# Patient Record
Sex: Female | Born: 2005 | Race: White | Hispanic: No | Marital: Single | Smoking: Current some day smoker
Health system: Southern US, Community
[De-identification: ages and names within clinical notes are randomized; demographics above are authoritative.]

## PROBLEM LIST (undated history)

## (undated) ENCOUNTER — Emergency Department (HOSPITAL_COMMUNITY): Discharge status: Absent without leave | Payer: Medicaid Other

## (undated) DIAGNOSIS — J45909 Unspecified asthma, uncomplicated: Secondary | ICD-10-CM

## (undated) DIAGNOSIS — F419 Anxiety disorder, unspecified: Secondary | ICD-10-CM

## (undated) DIAGNOSIS — F329 Major depressive disorder, single episode, unspecified: Secondary | ICD-10-CM

## (undated) DIAGNOSIS — G43909 Migraine, unspecified, not intractable, without status migrainosus: Secondary | ICD-10-CM

## (undated) DIAGNOSIS — F32A Depression, unspecified: Secondary | ICD-10-CM

## (undated) DIAGNOSIS — F509 Eating disorder, unspecified: Secondary | ICD-10-CM

## (undated) HISTORY — DX: Unspecified asthma, uncomplicated: J45.909

## (undated) HISTORY — PX: TOE SURGERY: SHX1073

---
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
## 2024-01-21 ENCOUNTER — Encounter: Payer: Self-pay | Admitting: Behavioral Health

## 2024-01-21 ENCOUNTER — Other Ambulatory Visit: Payer: Self-pay

## 2024-01-21 ENCOUNTER — Ambulatory Visit (HOSPITAL_COMMUNITY)
Admission: EM | Admit: 2024-01-21 | Discharge: 2024-01-21 | Disposition: A | Discharge status: Other Acute Inpatient Hospital | Payer: MEDICAID | Attending: Nurse Practitioner | Admitting: Nurse Practitioner

## 2024-01-21 ENCOUNTER — Inpatient Hospital Stay
Admission: AD | Admit: 2024-01-21 | Discharge: 2024-01-28 | DRG: 885 | Disposition: A | Discharge status: Home / Self Care | Payer: MEDICAID | Source: Other Acute Inpatient Hospital | Attending: Psychiatry | Admitting: Psychiatry

## 2024-01-21 DIAGNOSIS — F1290 Cannabis use, unspecified, uncomplicated: Secondary | ICD-10-CM | POA: Insufficient documentation

## 2024-01-21 DIAGNOSIS — F3481 Disruptive mood dysregulation disorder: Secondary | ICD-10-CM | POA: Insufficient documentation

## 2024-01-21 DIAGNOSIS — F332 Major depressive disorder, recurrent severe without psychotic features: Principal | ICD-10-CM | POA: Diagnosis present

## 2024-01-21 DIAGNOSIS — Z789 Other specified health status: Secondary | ICD-10-CM

## 2024-01-21 DIAGNOSIS — Z91410 Personal history of adult physical and sexual abuse: Secondary | ICD-10-CM

## 2024-01-21 DIAGNOSIS — Z91411 Personal history of adult psychological abuse: Secondary | ICD-10-CM | POA: Diagnosis not present

## 2024-01-21 DIAGNOSIS — Z9151 Personal history of suicidal behavior: Secondary | ICD-10-CM | POA: Diagnosis not present

## 2024-01-21 DIAGNOSIS — Z8659 Personal history of other mental and behavioral disorders: Secondary | ICD-10-CM | POA: Insufficient documentation

## 2024-01-21 DIAGNOSIS — F314 Bipolar disorder, current episode depressed, severe, without psychotic features: Principal | ICD-10-CM | POA: Diagnosis present

## 2024-01-21 DIAGNOSIS — R45851 Suicidal ideations: Secondary | ICD-10-CM | POA: Diagnosis present

## 2024-01-21 DIAGNOSIS — F603 Borderline personality disorder: Secondary | ICD-10-CM | POA: Diagnosis present

## 2024-01-21 DIAGNOSIS — Z79899 Other long term (current) drug therapy: Secondary | ICD-10-CM

## 2024-01-21 DIAGNOSIS — Z818 Family history of other mental and behavioral disorders: Secondary | ICD-10-CM | POA: Diagnosis not present

## 2024-01-21 DIAGNOSIS — Z560 Unemployment, unspecified: Secondary | ICD-10-CM

## 2024-01-21 DIAGNOSIS — F4310 Post-traumatic stress disorder, unspecified: Secondary | ICD-10-CM | POA: Diagnosis present

## 2024-01-21 DIAGNOSIS — F3130 Bipolar disorder, current episode depressed, mild or moderate severity, unspecified: Principal | ICD-10-CM | POA: Insufficient documentation

## 2024-01-21 DIAGNOSIS — F17210 Nicotine dependence, cigarettes, uncomplicated: Secondary | ICD-10-CM | POA: Diagnosis present

## 2024-01-21 DIAGNOSIS — F419 Anxiety disorder, unspecified: Secondary | ICD-10-CM | POA: Diagnosis present

## 2024-01-21 DIAGNOSIS — Z7289 Other problems related to lifestyle: Secondary | ICD-10-CM

## 2024-01-21 DIAGNOSIS — Z23 Encounter for immunization: Secondary | ICD-10-CM | POA: Diagnosis not present

## 2024-01-21 DIAGNOSIS — F509 Eating disorder, unspecified: Secondary | ICD-10-CM | POA: Insufficient documentation

## 2024-01-21 DIAGNOSIS — Z716 Tobacco abuse counseling: Secondary | ICD-10-CM

## 2024-01-21 DIAGNOSIS — F909 Attention-deficit hyperactivity disorder, unspecified type: Secondary | ICD-10-CM | POA: Diagnosis present

## 2024-01-21 DIAGNOSIS — Z9152 Personal history of nonsuicidal self-harm: Secondary | ICD-10-CM | POA: Diagnosis not present

## 2024-01-21 DIAGNOSIS — F411 Generalized anxiety disorder: Secondary | ICD-10-CM | POA: Insufficient documentation

## 2024-01-21 DIAGNOSIS — K5900 Constipation, unspecified: Secondary | ICD-10-CM | POA: Diagnosis present

## 2024-01-21 DIAGNOSIS — F1210 Cannabis abuse, uncomplicated: Secondary | ICD-10-CM | POA: Diagnosis present

## 2024-01-21 DIAGNOSIS — J45909 Unspecified asthma, uncomplicated: Secondary | ICD-10-CM | POA: Diagnosis present

## 2024-01-21 DIAGNOSIS — F1090 Alcohol use, unspecified, uncomplicated: Secondary | ICD-10-CM

## 2024-01-21 LAB — CBC WITH DIFFERENTIAL/PLATELET
Abs Immature Granulocytes: 0.08 10*3/uL — ABNORMAL HIGH (ref 0.00–0.07)
Basophils Absolute: 0.1 10*3/uL (ref 0.0–0.1)
Basophils Relative: 0 %
Eosinophils Absolute: 0.1 10*3/uL (ref 0.0–0.5)
Eosinophils Relative: 0 %
HCT: 46.6 % — ABNORMAL HIGH (ref 36.0–46.0)
Hemoglobin: 15.7 g/dL — ABNORMAL HIGH (ref 12.0–15.0)
Immature Granulocytes: 1 %
Lymphocytes Relative: 15 %
Lymphs Abs: 2.4 10*3/uL (ref 0.7–4.0)
MCH: 28.8 pg (ref 26.0–34.0)
MCHC: 33.7 g/dL (ref 30.0–36.0)
MCV: 85.5 fL (ref 80.0–100.0)
Monocytes Absolute: 0.7 10*3/uL (ref 0.1–1.0)
Monocytes Relative: 4 %
Neutro Abs: 12.5 10*3/uL — ABNORMAL HIGH (ref 1.7–7.7)
Neutrophils Relative %: 80 %
Platelets: 458 10*3/uL — ABNORMAL HIGH (ref 150–400)
RBC: 5.45 MIL/uL — ABNORMAL HIGH (ref 3.87–5.11)
RDW: 11.9 % (ref 11.5–15.5)
WBC: 15.8 10*3/uL — ABNORMAL HIGH (ref 4.0–10.5)
nRBC: 0 % (ref 0.0–0.2)

## 2024-01-21 LAB — ETHANOL
Alcohol, Ethyl (B): <10 mg/dL (ref <10)
Alcohol, Ethyl (B): <10 mg/dL (ref <10)

## 2024-01-21 LAB — COMPREHENSIVE METABOLIC PANEL
ALT: 29 U/L (ref 0–44)
AST: 23 U/L (ref 15–41)
Albumin: 4.3 g/dL (ref 3.5–5.0)
Alkaline Phosphatase: 75 U/L (ref 38–126)
Anion gap: 14 (ref 5–15)
BUN: 9 mg/dL (ref 6–20)
CO2: 23 mmol/L (ref 22–32)
Calcium: 9.9 mg/dL (ref 8.9–10.3)
Chloride: 103 mmol/L (ref 98–111)
Creatinine, Ser: 0.75 mg/dL (ref 0.44–1.00)
GFR, Estimated: >60 mL/min (ref >60)
Glucose, Bld: 68 mg/dL — ABNORMAL LOW (ref 70–99)
Potassium: 4 mmol/L (ref 3.5–5.1)
Sodium: 140 mmol/L (ref 135–145)
Total Bilirubin: 1.1 mg/dL (ref 0.0–1.2)
Total Protein: 7.4 g/dL (ref 6.5–8.1)

## 2024-01-21 LAB — POCT URINE DRUG SCREEN - MANUAL ENTRY (I-SCREEN)
POC Amphetamine UR: NOT DETECTED
POC Buprenorphine (BUP): NOT DETECTED
POC Cocaine UR: NOT DETECTED
POC Marijuana UR: POSITIVE — AB
POC Methadone UR: NOT DETECTED
POC Methamphetamine UR: NOT DETECTED
POC Morphine: NOT DETECTED
POC Oxazepam (BZO): NOT DETECTED
POC Oxycodone UR: NOT DETECTED
POC Secobarbital (BAR): NOT DETECTED

## 2024-01-21 LAB — LIPID PANEL
Cholesterol: 151 mg/dL (ref 0–169)
HDL: 49 mg/dL (ref >40)
LDL Cholesterol: 88 mg/dL (ref 0–99)
Total CHOL/HDL Ratio: 3.1 {ratio}
Triglycerides: 71 mg/dL (ref <150)
VLDL: 14 mg/dL (ref 0–40)

## 2024-01-21 LAB — HEMOGLOBIN A1C
Hgb A1c MFr Bld: 5.2 % (ref 4.8–5.6)
Mean Plasma Glucose: 102.54 mg/dL

## 2024-01-21 LAB — TSH: TSH: 0.554 u[IU]/mL (ref 0.350–4.500)

## 2024-01-21 LAB — POC URINE PREG, ED: Preg Test, Ur: NEGATIVE

## 2024-01-21 MED ORDER — ACETAMINOPHEN 325 MG PO TABS
650.0000 mg | ORAL_TABLET | Freq: Four times a day (QID) | ORAL | Status: DC | PRN
  Administered 2024-01-21: 650 mg via ORAL
  Filled 2024-01-21: qty 2

## 2024-01-21 MED ORDER — DIPHENHYDRAMINE HCL 50 MG/ML IJ SOLN: 50.0000 mg | Freq: Three times a day (TID) | INTRAMUSCULAR | Status: DC | PRN

## 2024-01-21 MED ORDER — ADULT MULTIVITAMIN W/MINERALS CH
1.0000 | ORAL_TABLET | Freq: Every day | ORAL | Status: DC
  Administered 2024-01-21 – 2024-01-28 (×8): 1 via ORAL
  Filled 2024-01-21 (×8): qty 1

## 2024-01-21 MED ORDER — ACETAMINOPHEN 325 MG PO TABS
650.0000 mg | ORAL_TABLET | Freq: Four times a day (QID) | ORAL | Status: DC | PRN
  Administered 2024-01-21 – 2024-01-28 (×3): 650 mg via ORAL
  Filled 2024-01-21 (×4): qty 2

## 2024-01-21 MED ORDER — HALOPERIDOL LACTATE 5 MG/ML IJ SOLN: 5.0000 mg | Freq: Three times a day (TID) | INTRAMUSCULAR | Status: DC | PRN

## 2024-01-21 MED ORDER — DIPHENHYDRAMINE HCL 25 MG PO CAPS: 50.0000 mg | ORAL_CAPSULE | Freq: Three times a day (TID) | ORAL | Status: DC | PRN

## 2024-01-21 MED ORDER — LORAZEPAM 2 MG/ML IJ SOLN: 2.0000 mg | Freq: Three times a day (TID) | INTRAMUSCULAR | Status: DC | PRN

## 2024-01-21 MED ORDER — FOLIC ACID 1 MG PO TABS
1.0000 mg | ORAL_TABLET | Freq: Every day | ORAL | Status: DC
  Administered 2024-01-21 – 2024-01-28 (×8): 1 mg via ORAL
  Filled 2024-01-21 (×8): qty 1

## 2024-01-21 MED ORDER — NICOTINE POLACRILEX 2 MG MT GUM: 2.0000 mg | CHEWING_GUM | OROMUCOSAL | Status: DC | PRN

## 2024-01-21 MED ORDER — LORAZEPAM 2 MG PO TABS
0.0000 mg | ORAL_TABLET | Freq: Two times a day (BID) | ORAL | Status: AC
  Administered 2024-01-24: 1 mg via ORAL
  Filled 2024-01-21 (×2): qty 1

## 2024-01-21 MED ORDER — IBUPROFEN 200 MG PO TABS
400.0000 mg | ORAL_TABLET | Freq: Three times a day (TID) | ORAL | Status: DC | PRN
Start: 2024-01-21 — End: 2024-01-28
  Administered 2024-01-27 (×2): 400 mg via ORAL
  Filled 2024-01-21 (×2): qty 2

## 2024-01-21 MED ORDER — ALUM & MAG HYDROXIDE-SIMETH 200-200-20 MG/5ML PO SUSP: 30.0000 mL | ORAL | Status: DC | PRN

## 2024-01-21 MED ORDER — FLUOXETINE HCL 20 MG PO CAPS
40.0000 mg | ORAL_CAPSULE | Freq: Every day | ORAL | Status: DC
  Administered 2024-01-21: 40 mg via ORAL
  Filled 2024-01-21: qty 2

## 2024-01-21 MED ORDER — TRAZODONE HCL 50 MG PO TABS: 50.0000 mg | ORAL_TABLET | Freq: Every evening | ORAL | Status: DC | PRN

## 2024-01-21 MED ORDER — MAGNESIUM HYDROXIDE 400 MG/5ML PO SUSP: 30.0000 mL | Freq: Every day | ORAL | Status: DC | PRN

## 2024-01-21 MED ORDER — ATOMOXETINE HCL 25 MG PO CAPS: 25.0000 mg | ORAL_CAPSULE | Freq: Every day | ORAL | Status: DC

## 2024-01-21 MED ORDER — GUANFACINE HCL ER 2 MG PO TB24: 4.0000 mg | ORAL_TABLET | Freq: Every day | ORAL | Status: DC

## 2024-01-21 MED ORDER — LORAZEPAM 2 MG PO TABS
0.0000 mg | ORAL_TABLET | Freq: Four times a day (QID) | ORAL | Status: AC
  Administered 2024-01-21 – 2024-01-22 (×4): 1 mg via ORAL
  Filled 2024-01-21 (×5): qty 1

## 2024-01-21 MED ORDER — LORAZEPAM 2 MG/ML IJ SOLN: 1.0000 mg | INTRAMUSCULAR | Status: AC | PRN

## 2024-01-21 MED ORDER — THIAMINE MONONITRATE 100 MG PO TABS
100.0000 mg | ORAL_TABLET | Freq: Every day | ORAL | Status: DC
  Administered 2024-01-21 – 2024-01-28 (×8): 100 mg via ORAL
  Filled 2024-01-21 (×8): qty 1

## 2024-01-21 MED ORDER — INFLUENZA VIRUS VACC SPLIT PF (FLUZONE) 0.5 ML IM SUSY
0.5000 mL | PREFILLED_SYRINGE | INTRAMUSCULAR | Status: AC
  Administered 2024-01-23: 0.5 mL via INTRAMUSCULAR
  Filled 2024-01-21: qty 0.5

## 2024-01-21 MED ORDER — GUANFACINE HCL ER 1 MG PO TB24
4.0000 mg | ORAL_TABLET | Freq: Every day | ORAL | Status: DC
Start: 2024-01-21 — End: 2024-01-28
  Administered 2024-01-21 – 2024-01-27 (×6): 4 mg via ORAL
  Filled 2024-01-21 (×7): qty 4

## 2024-01-21 MED ORDER — CARIPRAZINE HCL 1.5 MG PO CAPS
1.5000 mg | ORAL_CAPSULE | Freq: Every day | ORAL | Status: DC
  Administered 2024-01-21: 1.5 mg via ORAL
  Filled 2024-01-21: qty 1

## 2024-01-21 MED ORDER — FLUOXETINE HCL 20 MG PO CAPS
40.0000 mg | ORAL_CAPSULE | Freq: Every day | ORAL | Status: DC
  Administered 2024-01-22 – 2024-01-26 (×5): 40 mg via ORAL
  Filled 2024-01-21 (×5): qty 2

## 2024-01-21 MED ORDER — HALOPERIDOL 5 MG PO TABS: 5.0000 mg | ORAL_TABLET | Freq: Three times a day (TID) | ORAL | Status: DC | PRN

## 2024-01-21 MED ORDER — IBUPROFEN 400 MG PO TABS
400.0000 mg | ORAL_TABLET | Freq: Three times a day (TID) | ORAL | Status: DC | PRN
  Administered 2024-01-21: 400 mg via ORAL
  Filled 2024-01-21: qty 1

## 2024-01-21 MED ORDER — THIAMINE HCL 100 MG/ML IJ SOLN: 100.0000 mg | Freq: Every day | INTRAMUSCULAR | Status: DC

## 2024-01-21 MED ORDER — HYDROXYZINE HCL 25 MG PO TABS
25.0000 mg | ORAL_TABLET | Freq: Three times a day (TID) | ORAL | Status: DC | PRN
  Administered 2024-01-21 – 2024-01-23 (×2): 25 mg via ORAL
  Filled 2024-01-21 (×2): qty 1

## 2024-01-21 MED ORDER — ONDANSETRON 4 MG PO TBDP
4.0000 mg | ORAL_TABLET | Freq: Three times a day (TID) | ORAL | Status: DC | PRN
Start: 2024-01-21 — End: 2024-01-28
  Administered 2024-01-21: 4 mg via ORAL
  Filled 2024-01-21: qty 1

## 2024-01-21 MED ORDER — LORAZEPAM 1 MG PO TABS: 1.0000 mg | ORAL_TABLET | ORAL | Status: AC | PRN

## 2024-01-21 MED ORDER — TRAZODONE HCL 50 MG PO TABS
50.0000 mg | ORAL_TABLET | Freq: Every evening | ORAL | Status: DC | PRN
  Administered 2024-01-21 – 2024-01-26 (×5): 50 mg via ORAL
  Filled 2024-01-21 (×5): qty 1

## 2024-01-21 MED ORDER — MAGNESIUM HYDROXIDE 400 MG/5ML PO SUSP
30.0000 mL | Freq: Every day | ORAL | Status: DC | PRN
  Filled 2024-01-21: qty 30

## 2024-01-21 MED ORDER — HYDROXYZINE HCL 25 MG PO TABS: 25.0000 mg | ORAL_TABLET | Freq: Three times a day (TID) | ORAL | Status: DC | PRN

## 2024-01-21 MED ORDER — CARIPRAZINE HCL 1.5 MG PO CAPS
1.5000 mg | ORAL_CAPSULE | Freq: Every day | ORAL | Status: DC
Start: 2024-01-21 — End: 2024-01-22
  Administered 2024-01-22: 1.5 mg via ORAL
  Filled 2024-01-21 (×2): qty 1

## 2024-01-21 NOTE — ED Notes (Signed)
 Patient A&O x 4, ambulatory. Patient transferred to [facility] in no acute distress. Patient denied SI/HI, A/VH upon discharge. Patient escorted to [place] via staff for transport to [facility] via safe transport. Safety maintained.

## 2024-01-21 NOTE — Progress Notes (Signed)
   01/21/24 1830  Intake (mL)  Percent Meals Eaten (%) 50 %  Feeding Fed self  Other  (MHT stated that she drank half of her Gingerale; 118 mL)

## 2024-01-21 NOTE — Progress Notes (Signed)
   01/21/24 1800  CIWA-Ar  Nausea and Vomiting 2  Tactile Disturbances 0  Tremor 0  Auditory Disturbances 0  Paroxysmal Sweats 1  Visual Disturbances 0  Anxiety 2  Headache, Fullness in Head 3  Agitation 0  Orientation and Clouding of Sensorium 0  CIWA-Ar Total 8

## 2024-01-21 NOTE — Discharge Instructions (Signed)
 Patient accepted to [facility]. Attending MD Parmar  [facility] BMU ADDRESS IS [address] [place].

## 2024-01-21 NOTE — BHH Suicide Risk Assessment (Signed)
 [facility] Admission Suicide Risk Assessment   Nursing information obtained from:    Demographic factors:    Current Mental Status:    Loss Factors:    Historical Factors:    Risk Reduction Factors:     Total Time spent with patient: 2 hours Principal Problem: MDD (major depressive disorder), recurrent severe, without psychosis (HCC) Diagnosis:  Principal Problem:   MDD (major depressive disorder), recurrent severe, without psychosis (HCC) Active Problems:   Self-injurious behavior   Self-induced vomiting for weight loss   Borderline personality disorder (HCC)   Subjective Data: 18 year old female Caucasian  emale,ports feeling severely depressed and suicidal, stating she has felt this way since age 18.Reports suicidal ideation with a plan to choke herself with a advertising account planner.States she developed this plan a few days ago.Reports previous suicide attempt one week ago by overdosing on five trazodone  100 mg tablets and consuming a bottle of wine.Reports a history of self-harm, including cutting her throat with a razor blade a few weeks ago.Denies homicidal ideation, hallucinations, or access to weapons.Reports engaging in purging behaviors, stating Sometimes I stick my finger down my throat to throw up. I want to be skinny.Reports daily marijuana use (2 blunts and gummies) and drinking a bottle of wine nightly for the past week.States she wants to detox from alcohol and marijuana. Patient acknowledges she cannot keep herself safe if discharged.Reports nonadherence to prescribed medications.Patient states she has been drinking a bottle of wine and using marijuana (two blunts and gummies) nightly for the past week and expresses a desire to detox. She identifies multiple stressors, including her mother's recent move, a breakup, her grandfather's critical illness, and concerns about her elderly dog.She has a pending therapy appointment with Ellouise Dawn in April 2025. She states she does not feel safe to return  home. Her grandmother supports this concern and does not believe the patient will be safe if discharged.    Continued Clinical Symptoms:     The Alcohol Use Disorders Identification Test, Guidelines for Use in Primary Care, Second Edition.  World Science Writer [facility]). Score between 0-7:  no or low risk or alcohol related problems. Score between 8-15:  moderate risk of alcohol related problems. Score between 16-19:  high risk of alcohol related problems. Score 20 or above:  warrants further diagnostic evaluation for alcohol dependence and treatment.   CLINICAL FACTORS:   Depression:   Comorbid alcohol abuse/dependence Impulsivity Alcohol/Substance Abuse/Dependencies Personality Disorders:   Cluster B Comorbid alcohol abuse/dependence Comorbid depression More than one psychiatric diagnosis   Musculoskeletal: Strength & Muscle Tone: within normal limits Gait & Station: normal Patient leans: N/A  Psychiatric Specialty Exam:  Presentation  General Appearance:  Appropriate for Environment; Neat (appears fatigued)  Eye Contact: Minimal  Speech: Clear and Coherent; Normal Rate  Speech Volume: Normal  Handedness: Right   Mood and Affect  Mood: Depressed  Affect: Flat; Restricted; Congruent   Thought Process  Thought Processes: Coherent  Descriptions of Associations:Intact  Orientation:Full (Time, Place and Person)  Thought Content:WDL  History of Schizophrenia/Schizoaffective disorder:No   Hallucinations:Hallucinations: None  Ideas of Reference:None  Suicidal Thoughts:Suicidal Thoughts: Yes, Passive SI Active Intent and/or Plan: Without Intent; Without Means to Carry Out; Without Access to Means; Without Plan SI Passive Intent and/or Plan: Without Intent; Without Plan; Without Means to Carry Out; Without Access to Means  Homicidal Thoughts:Homicidal Thoughts: No   Sensorium  Memory: Immediate Good; Recent Good; Remote  Good  Judgment: Impaired  Insight: Poor   Art Therapist  Concentration: Fair  Attention Span: Fair  Recall: Good  Fund of Knowledge: Good  Language: Good   Psychomotor Activity  Psychomotor Activity: Psychomotor Activity: Normal   Assets  Assets: Social Support; Health And Safety Inspector; Desire for Improvement; Communication Skills   Sleep  Sleep: Sleep: Fair Number of Hours of Sleep: 4    Physical Exam: Physical Exam Vitals and nursing note reviewed.  Constitutional:      Appearance: Normal appearance.  HENT:     Head: Normocephalic and atraumatic.     Nose: Nose normal.  Pulmonary:     Effort: Pulmonary effort is normal.  Musculoskeletal:        General: Normal range of motion.     Cervical back: Normal range of motion.  Neurological:     General: No focal deficit present.     Mental Status: She is alert and oriented to person, place, and time. Mental status is at baseline.  Psychiatric:        Attention and Perception: Attention and perception normal.        Mood and Affect: Mood is anxious and depressed. Affect is flat.        Speech: Speech normal.        Behavior: Behavior is withdrawn. Behavior is cooperative.        Thought Content: Thought content includes suicidal ideation.        Cognition and Memory: Cognition and memory normal.        Judgment: Judgment is impulsive.    Review of Systems  Gastrointestinal:  Positive for nausea and vomiting.  Psychiatric/Behavioral:  Positive for substance abuse and suicidal ideas. The patient is nervous/anxious.   All other systems reviewed and are negative.  Blood pressure (!) 135/95, pulse 93, temperature 98.5 F (36.9 C), temperature source Oral, resp. rate 18, SpO2 95%. There is no height or weight on file to calculate BMI.   COGNITIVE FEATURES THAT CONTRIBUTE TO RISK:  None    SUICIDE RISK:   Mild:  Suicidal ideation of limited frequency, intensity, duration, and  specificity.  There are no identifiable plans, no associated intent, mild dysphoria and related symptoms, good self-control (both objective and subjective assessment), few other risk factors, and identifiable protective factors, including available and accessible social support.  PLAN OF CARE:  Aripiprazole  (Abilify ) 10 mg daily - Augmentation for depressive symptoms and mood stabilization. Hydroxyzine  (Atarax ) 25 mg three times daily as needed - Anxiety management and sedation. Cariprazine  (Vraylar ) 1.5 mg daily - Mood stabilization for underlying mood disorder. Fluoxetine  (Prozac ) 40 mg daily - Treatment for major depressive disorder. Ondansetron  (Zofran ) 4 mg as needed - To manage nausea associated with anxiety and withdrawal symptoms. CIWA with Ativan  protocol - To assess and manage potential alcohol withdrawal symptoms.  Monitor for suicidal ideation and self-harm behaviors  Implement eating disorder monitoring. I certify that inpatient services furnished can reasonably be expected to improve the patient's condition.   Brad GORMAN Moats, NP 01/21/2024, 5:23 PM
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
## 2024-01-21 NOTE — Group Note (Signed)
 Date:  01/21/2024 Time:  11:05 PM  Group Topic/Focus:  Wrap-Up Group:   The focus of this group is to help patients review their daily goal of treatment and discuss progress on daily workbooks.    Participation Level:  Did Not Attend Maglione,Randell Detter E 01/21/2024, 11:05 PM

## 2024-01-21 NOTE — Group Note (Signed)
 Date:  01/21/2024 Time:  5:42 PM  Group Topic/Focus:  Outdoor recreation  therapy  structured activity    Participation Level:  Did Not Attend   Debbie Long 01/21/2024, 5:42 PM

## 2024-01-21 NOTE — ED Notes (Signed)
 Nurse to nurse report given to Demetria, RN @[facility] . Safe transport called. Pt made aware and agree to transport. Safety maintained.

## 2024-01-21 NOTE — ED Notes (Signed)
 Patient A&Ox4. Pleasant when approached. Pt denies SI at present but states, It always happen around 8pm. It's like routine. In the day, I do things to occupy my time so I'm distracted from those thoughts to hurt myself but at night, I'm consumed by the thoughts to end my life. I've been like this since I was 18yo. Like, I don't feel like it right at this moment but I know if I go home, I'm gonna do something to myself. In here, I'm safe but not at home. Support and encouragement provided. Denies A/VH. Patient c/o migraine 4/10. Pt received Tylenol  earlier with some relief noted. Motrin  ordered and will be administered. Routine safety checks conducted according to facility protocol. Encouraged patient to notify staff if thoughts of harm toward self or others arise. Patient verbalize understanding and agreement. Able to contract for safety in facility only. Pt accepted to [facility]. Voluntary consent form faxed successfully to facility. Awaiting callback to give nurse to nurse report. Will continue to monitor for safety.

## 2024-01-21 NOTE — Plan of Care (Signed)
  Problem: Education: Goal: Knowledge of [facility] Education information/materials will improve Outcome: Progressing Goal: Emotional status will improve Outcome: Progressing Goal: Mental status will improve Outcome: Progressing Goal: Verbalization of understanding the information provided will improve Outcome: Progressing   Problem: Activity: Goal: Interest or engagement in activities will improve Outcome: Progressing Goal: Sleeping patterns will improve Outcome: Progressing   Problem: Coping: Goal: Ability to verbalize frustrations and anger appropriately will improve Outcome: Progressing Goal: Ability to demonstrate self-control will improve Outcome: Progressing   Problem: Health Behavior/Discharge Planning: Goal: Identification of resources available to assist in meeting health care needs will improve Outcome: Progressing Goal: Compliance with treatment plan for underlying cause of condition will improve Outcome: Progressing   Problem: Physical Regulation: Goal: Ability to maintain clinical measurements within normal limits will improve Outcome: Progressing   Problem: Safety: Goal: Periods of time without injury will increase Outcome: Progressing   Problem: Coping: Goal: Coping ability will improve Outcome: Progressing Goal: Will verbalize feelings Outcome: Progressing   Problem: Self-Concept: Goal: Ability to identify factors that promote anxiety will improve Outcome: Progressing Goal: Level of anxiety will decrease Outcome: Progressing Goal: Ability to modify response to factors that promote anxiety will improve Outcome: Progressing   Problem: Physical Regulation: Goal: Complications related to the disease process, condition or treatment will be avoided or minimized Outcome: Progressing   Problem: Safety: Goal: Ability to remain free from injury will improve Outcome: Progressing   Problem: Health Behavior/Discharge Planning: Goal:  Identification of resources available to assist in meeting health care needs will improve Outcome: Progressing

## 2024-01-21 NOTE — ED Provider Notes (Signed)
 FBC/OBS ASAP Discharge Summary  Date and Time: 01/21/2024 8:37 AM  Name: Debbie Long  MRN:  [id number]   Discharge Diagnoses:  Final diagnoses:  Suicidal ideation  Severe episode of recurrent major depressive disorder, without psychotic features (HCC)  PTSD (post-traumatic stress disorder)    Subjective: Patient accepted to [facility] this morning. Patient evaluated face-to-face by this provider prior to transfer. Patient initially presented with complaints of suicidal ideations with a plan to choke herself with a advertising account planner. On reevaluation, patient denies active suicidal ideations at this time. She denies homicidal ideations.  She denies auditory or visual hallucinations. There is no objective evidence that the patient is currently responding to internal or external stimuli, or experiencing delusional paranoia thought content. Patient reports worsening depression. She reports physical complaints of a migraine that she rates 6 out of 10 with 10 being the worst. She denies taking prescribed medications for migraines. Patient offered Motrin  as needed for pain alternating with Tylenol  as needed for pain. Patient stable to transfer to the [facility] this morning.  Stay Summary: Per chart review, H&P., Debbie Long is a 17 y.o. female with a history of DMDD, MDD, GAD, PTSD, suicidal ideations and eating disorders patient presented to [facility] as a walk in accompanied by grandmother Debbie Long with complaints of worsening depression and suicidal ideation.   On evaluation patient reports that she is depressed and suicidal and that she has been suicidal since she was 18 years old. Patient states that she has been smoking marijuana ( 2 blunts) and using marijuana gummies and drinking a 1 bottle of wine each night for the past week. Patient reports multiple stressors that include her mother recently moving away to get married in  [place] , a relationship break up, grandfather is very ill, her dog is elderly and not in good health. Patient states that she dos not feel that she can keep her self safe if she returns home. Patient stated that a week ago she attempted an overdose on five trazodone  100 mg tabs and one bottle of wine. Patient reports that she fell asleep and woke up the next day. Patient reports that she did not seek any treatment for overdose attempt. Patient reports a few weeks before her trazodone  overdose patient stated that she cut her throat with a razor blade.   Patient is currently being followed by Cletis Li for outpatient services and is currently prescribed Strattera  25 mg, Prozac  40 mg, Vraylar  1.5 mg,  hydroxyzine  25 mg and guanfacine  4 mg. Patient states that she is not currently in therapy, but has an initial therapy appointment in April with [facility] outpatient clinic.  Total Time spent with patient: 30 minutes  Past Psychiatric History: History of PTSD, DMDD, MDD and ADHD. Patient hospitalized at [facility]10[facility].   Past Medical History: History of migraines. Patient denies taking prescribed medications for migraines at this time.   Family Psychiatric History: Mother history of bipolar and brother hx of autism.   Social History: Patient resides with her (m) grandparents. Patient states that she graduated from high school in December 2024. Patient reports occasional alcohol and marijuana use.  Tobacco Cessation:  N/A, patient does not currently use tobacco products  Current Medications:  Current Facility-Administered Medications  Medication Dose Route Frequency Provider Last Rate Last Admin   acetaminophen  (TYLENOL ) tablet 650 mg  650 mg Oral Q6H PRN Bobbitt, Shalon E, NP   650 mg at  01/21/24 0422   alum & mag hydroxide-simeth (MAALOX/MYLANTA) 200-200-20 MG/5ML suspension 30 mL  30 mL Oral Q4H PRN Bobbitt, Shalon E, NP       cariprazine  (VRAYLAR ) capsule 1.5 mg  1.5 mg Oral Daily Bobbitt,  Shalon E, NP       FLUoxetine  (PROZAC ) capsule 40 mg  40 mg Oral Daily Bobbitt, Shalon E, NP       guanFACINE  (INTUNIV ) ER tablet 4 mg  4 mg Oral QHS Bobbitt, Shalon E, NP       hydrOXYzine  (ATARAX ) tablet 25 mg  25 mg Oral TID PRN Bobbitt, Shalon E, NP       ibuprofen  (ADVIL ) tablet 400 mg  400 mg Oral Q8H PRN Atiyah Bauer L, NP       magnesium  hydroxide (MILK OF MAGNESIA) suspension 30 mL  30 mL Oral Daily PRN Bobbitt, Shalon E, NP       traZODone  (DESYREL ) tablet 50 mg  50 mg Oral QHS PRN Bobbitt, Shalon E, NP       Current Outpatient Medications  Medication Sig Dispense Refill   albuterol  (PROVENTIL  HFA;VENTOLIN  HFA) 108 (90 Base) MCG/ACT inhaler Inhale 2 puffs into the lungs every 4 (four) hours as needed for wheezing or shortness of breath.     cariprazine  (VRAYLAR ) 1.5 MG capsule Take by mouth.     FLUoxetine  (PROZAC ) 20 MG capsule Take 1 capsule (20 mg total) by mouth daily. 30 capsule 0    PTA Medications:  Facility Ordered Medications  Medication   acetaminophen  (TYLENOL ) tablet 650 mg   alum & mag hydroxide-simeth (MAALOX/MYLANTA) 200-200-20 MG/5ML suspension 30 mL   magnesium  hydroxide (MILK OF MAGNESIA) suspension 30 mL   hydrOXYzine  (ATARAX ) tablet 25 mg   traZODone  (DESYREL ) tablet 50 mg   cariprazine  (VRAYLAR ) capsule 1.5 mg   FLUoxetine  (PROZAC ) capsule 40 mg   guanFACINE  (INTUNIV ) ER tablet 4 mg   ibuprofen  (ADVIL ) tablet 400 mg   PTA Medications  Medication Sig   albuterol  (PROVENTIL  HFA;VENTOLIN  HFA) 108 (90 Base) MCG/ACT inhaler Inhale 2 puffs into the lungs every 4 (four) hours as needed for wheezing or shortness of breath.   FLUoxetine  (PROZAC ) 20 MG capsule Take 1 capsule (20 mg total) by mouth daily.   cariprazine  (VRAYLAR ) 1.5 MG capsule Take by mouth.    Flowsheet Row ED from 01/21/2024 in [facility] ED from 10/08/2023 in [facility] Emergency Department at [facility]mission (Discharged[facility]  C-SSRS RISK CATEGORY High Risk High Risk High Risk       Musculoskeletal  Strength & Muscle Tone: within normal limits Gait & Station: normal Patient leans: N/A  Psychiatric Specialty Exam  Presentation  General Appearance:  Casual  Eye Contact: Good  Speech: Clear and Coherent  Speech Volume: Normal  Handedness: Right   Mood and Affect  Mood: Euthymic  Affect: Appropriate   Thought Process  Thought Processes: Coherent  Descriptions of Associations:Intact  Orientation:Full (Time, Place and Person)  Thought Content:WDL  Diagnosis of Schizophrenia or Schizoaffective disorder in past: No    Hallucinations:Hallucinations: None  Ideas of Reference:None  Suicidal Thoughts:No  Homicidal Thoughts:Homicidal Thoughts: No   Sensorium  Memory: Immediate Fair; Recent Fair; Remote Fair  Judgment: Poor  Insight: Fair   Chartered Certified Accountant: Fair  Attention Span: Fair  Recall: [employer] of Knowledge: Fair  Language: Fair   Psychomotor Activity  Psychomotor Activity: Psychomotor Activity: Normal  Assets  Assets: Manufacturing Systems Engineer; Housing; Resilience; Social Support   Sleep  Sleep: Sleep: Fair Number of Hours of Sleep: 5   Nutritional Assessment (For OBS and FBC admissions only) Has the patient had a weight loss or gain of 10 pounds or more in the last 3 months?: No Has the patient had a decrease in food intake/or appetite?: No Does the patient have dental problems?: No Does the patient have eating habits or behaviors that may be indicators of an eating disorder including binging or inducing vomiting?: No Has the patient recently lost weight without trying?: 0 Has the patient been eating poorly because of a decreased appetite?: 0 Malnutrition Screening Tool Score: 0    Physical Exam  Physical Exam Cardiovascular:     Rate and Rhythm: Normal rate.   Pulmonary:     Effort: Pulmonary effort is normal.  Musculoskeletal:        General: Normal range of motion.     Cervical back: Normal range of motion.  Neurological:     Mental Status: She is alert and oriented to person, place, and time.    Review of Systems  Constitutional: Negative.   HENT: Negative.    Eyes: Negative.   Respiratory: Negative.    Cardiovascular: Negative.   Gastrointestinal: Negative.   Genitourinary: Negative.   Musculoskeletal: Negative.   Neurological:  Positive for headaches.  Endo/Heme/Allergies: Negative.   Psychiatric/Behavioral:  Positive for depression and suicidal ideas.    Blood pressure (!) 157/90, pulse 84, temperature 98.8 F (37.1 C), temperature source Oral, resp. rate 16, SpO2 98%. There is no height or weight on file to calculate BMI.  Disposition: Patient accepted to  [facility] BMU. Attending MD Victoria. Patient is voluntary. Admission orders placed for [facility].   Teresa Wyline CROME, NP 01/21/2024, 8:37 AM
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
## 2024-01-21 NOTE — ED Notes (Signed)
 Pt A&O x 4, sad & depressed, presents with suicidal ideation, plan to hang self with phone charger, previous SI attempts noted, overdose and attempt to cut throat.  Denies AVH. Pt contracts for safety.  Comfort measures given.  Monitoring for safety.

## 2024-01-21 NOTE — Progress Notes (Signed)
   01/21/24 1800  Charting Type  Charting Type Admission  Safety Check Verification  Has the RN verified the 15 minute safety check completion? Yes  Neurological  Neuro (WDL) WDL  HEENT  HEENT (WDL) X  R Eye Eyeglasses  L Eye Eyeglasses  Teeth Intact  Tongue Pink;Moist  Mucous Membrane(s) Moist;Pink  Voice Clear  Respiratory  Respiratory (WDL) WDL  Cardiac  Cardiac (WDL) WDL  Vascular  Vascular (WDL) WDL  Integumentary  Integumentary (WDL) WDL  Staff Member Assisting with Skin Assessment on Admission [place], RN  Skin Color Appropriate for ethnicity  Skin Condition Dry  Skin Integrity Intact;Other (Comment) (stretch marks)  Skin Turgor Non-tenting  Braden Scale (Ages 8 and up)  Sensory Perceptions 4  Moisture 4  Activity 4  Mobility 4  Nutrition 3  Friction and Shear 3  Braden Scale Score 22  Musculoskeletal  Musculoskeletal (WDL) WDL  Assistive Device None  Gastrointestinal  Gastrointestinal (WDL) WDL  Last BM Date  01/19/24  GU Assessment  Genitourinary (WDL) WDL  Neurological  Level of Consciousness Alert

## 2024-01-21 NOTE — BHH Counselor (Addendum)
 LCSWA attempted to complete the PSA with the patient but she was asleep and did not respond to name being called or knock on the door. When patient arrived she was sick so she went right to sleep.    Emil Harada, MSW, LCSWA

## 2024-01-21 NOTE — Tx Team (Signed)
 Initial Treatment Plan 01/21/2024 6:56 PM Debbie Long [id number]    PATIENT STRESSORS: Health problems   Marital or family conflict   Substance abuse     PATIENT STRENGTHS: Communication skills  Motivation for treatment/growth  Supportive family/friends    PATIENT IDENTIFIED PROBLEMS: SI prior to admission to BMU  Anxiety  Depression  Alcohol abuse  Substance use             DISCHARGE CRITERIA:  Ability to meet basic life and health needs Improved stabilization in mood, thinking, and/or behavior Medical problems require only outpatient monitoring Motivation to continue treatment in a less acute level of care Need for constant or close observation no longer present Reduction of life-threatening or endangering symptoms to within safe limits Withdrawal symptoms are absent or subacute and managed without 24-hour nursing intervention  PRELIMINARY DISCHARGE PLAN: Outpatient therapy Return to previous living arrangement  PATIENT/FAMILY INVOLVEMENT: This treatment plan has been presented to and reviewed with the patient, Debbie Long. The patient has been given the opportunity to ask questions and make suggestions.  Francely Craw, RN 01/21/2024, 6:56 PM

## 2024-01-21 NOTE — Progress Notes (Signed)
   01/21/24 1855  Psych Admission Type (Psych Patients Only)  Admission Status Voluntary  Psychosocial Assessment  Patient Complaints Anxiety;Depression (patient states I've been dealing with a lot, but didn't go into detail with this clinical research associate.)  Eye Contact Fair  Facial Expression Pained;Other (Comment) (patient reported that she isn't feeling well)  Affect Sullen  Speech Soft  Interaction Assertive  Motor Activity Slow  Appearance/Hygiene Unremarkable  Behavior Characteristics Cooperative;Appropriate to situation  Mood Sullen;Pleasant  Aggressive Behavior  Effect No apparent injury  Thought Process  Coherency Circumstantial  Content WDL  Delusions None reported or observed  Perception WDL  Hallucination None reported or observed  Judgment WDL  Confusion None  Danger to Self  Current suicidal ideation? Denies  Self-Injurious Behavior No self-injurious ideation or behavior indicators observed or expressed   Agreement Not to Harm Self Yes  Description of Agreement Verbal  Danger to Others  Danger to Others None reported or observed

## 2024-01-21 NOTE — ED Provider Notes (Signed)
 [facility] Continuous Assessment Admission H&P  Date: 01/21/24 Patient Name: Debbie Long MRN: [id number] Chief Complaint: suicidal ideation  Diagnoses:  Final diagnoses:  Suicidal ideation  Severe episode of recurrent major depressive disorder, without psychotic features (HCC)  PTSD (post-traumatic stress disorder)    HPI: BRITLEY Long is a 18 y.o. female with a history of DMDD, MDD, GAD, PTSD, suicidal ideations and eating disorders patient presented to [facility] as a walk in accompanied by grandmother Debbie Long with complaints of worsening depression and suicidal ideation.  Patient seen face to face by this provider and chart reviewed on 01/21/24.  On evaluation patient reports that she is depressed and suicidal and that she has been suicidal since she was 18 years old.  Patient states that she has been smoking marijuana ( 2 blunts) and using marijuana gummies and drinking a 1 bottle of wine each night for the past week.  Patient reports multiple stressors that include her mother recently moving away to get married in [place] , a relationship break up, grandfather is very ill, her dog is elderly and not in good health. Patient states that she dos not feel that she can keep her self safe if she returns home. Patient stated that a week ago she attempted an overdose on five trazodone  100 mg tabs and one bottle of wine. Patient reports that she fell asleep and woke up the next day. Patient reports that she did not seek any treatment for overdose attempt. Patient reports a few weeks before her trazodone  overdose patient stated that she cut her throat with a razor blade.  Patient is currently being followed by Cletis Li for outpatient services and is currently prescribed Strattera  25 mg, Prozac  40 mg, Vraylar  1.5 mg,  hydroxyzine  25 mg and guanfacine  4 mg.  Patient states that she is not currently in therapy, but has an initial therapy appointment in April with [facility] outpatient  clinic.  During evaluation patient is sitting in a chair in the assessment room and in no acute distress. She is alert, oriented x 4, calm, cooperative and attentive. Her mood is euthymic with congruent affect. normal speech, and behavior.  Patient denies any HI or AVH. Objectively there is no evidence of psychosis/mania or delusional thinking.  Patient is able to converse coherently, goal directed thoughts, no distractibility, or pre-occupation. Patient endorses suicidal ideation with a plan to choke herself with her phone charger.  Patient does not appear to be experiencing any psychosis, paranoia or delusions. Patient answered questions appropriately.   Patient recommended for inpatient treatment and will be admitted to [facility] continuous observation for crisis management, stabilization and safety.    Total Time spent with patient: 20 minutes  Musculoskeletal  Strength & Muscle Tone: within normal limits Gait & Station: normal Patient leans: N/A  Psychiatric Specialty Exam  Presentation General Appearance:  Casual  Eye Contact: Good  Speech: Clear and Coherent  Speech Volume: Normal  Handedness: Right   Mood and Affect  Mood: Euthymic  Affect: Appropriate   Thought Process  Thought Processes: Coherent  Descriptions of Associations:Intact  Orientation:Full (Time, Place and Person)  Thought Content:WDL    Hallucinations:Hallucinations: None  Ideas of Reference:None  Suicidal Thoughts:Suicidal Thoughts: Yes, Active SI Active Intent and/or Plan: With Intent; With Plan  Homicidal Thoughts:Homicidal Thoughts: No   Sensorium  Memory: Immediate Fair; Recent Fair; Remote Fair  Judgment: Poor  Insight: Fair   Chartered Certified Accountant: Fair  Attention Span: Fair  Recall:  Fair  [employer] of Knowledge: Fair  Language: Fair   Lexicographer Activity: Psychomotor Activity: Normal   Assets   Assets: Manufacturing Systems Engineer; Housing; Resilience; Social Support   Sleep  Sleep: Sleep: Fair Number of Hours of Sleep: 5   Nutritional Assessment (For OBS and FBC admissions only) Has the patient had a weight loss or gain of 10 pounds or more in the last 3 months?: No Has the patient had a decrease in food intake/or appetite?: No Does the patient have dental problems?: No Does the patient have eating habits or behaviors that may be indicators of an eating disorder including binging or inducing vomiting?: No Has the patient recently lost weight without trying?: 0 Has the patient been eating poorly because of a decreased appetite?: 0 Malnutrition Screening Tool Score: 0    Physical Exam HENT:     Head: Normocephalic and atraumatic.     Nose: Nose normal.  Eyes:     Pupils: Pupils are equal, round, and reactive to light.  Cardiovascular:     Rate and Rhythm: Normal rate.  Pulmonary:     Effort: Pulmonary effort is normal.  Abdominal:     General: Abdomen is flat.  Musculoskeletal:        General: Normal range of motion.     Cervical back: Normal range of motion.  Skin:    General: Skin is warm.  Neurological:     Mental Status: She is alert and oriented to person, place, and time.  Psychiatric:        Attention and Perception: Attention normal.        Mood and Affect: Mood is depressed. Affect is blunt.        Speech: Speech normal.        Behavior: Behavior is cooperative.        Thought Content: Thought content is not paranoid or delusional. Thought content includes suicidal ideation. Thought content does not include homicidal ideation. Thought content includes suicidal plan. Thought content does not include homicidal plan.        Cognition and Memory: Cognition normal.        Judgment: Judgment is impulsive.   Review of Systems  Constitutional: Negative.   HENT: Negative.    Eyes: Negative.   Respiratory: Negative.    Cardiovascular: Negative.    Gastrointestinal: Negative.   Genitourinary: Negative.   Musculoskeletal: Negative.   Skin: Negative.   Neurological: Negative.   Endo/Heme/Allergies: Negative.   Psychiatric/Behavioral:  Positive for suicidal ideas.     Blood pressure 136/86, pulse (!) 116, temperature 99.1 F (37.3 C), temperature source Oral, resp. rate 20, SpO2 97%. There is no height or weight on file to calculate BMI.  Past Psychiatric History: [facility] 07/10/23  Is the patient at risk to self? Yes  Has the patient been a risk to self in the past 6 months? Yes .    Has the patient been a risk to self within the distant past? Yes   Is the patient a risk to others? No   Has the patient been a risk to others in the past 6 months? No   Has the patient been a risk to others within the distant past? No   Past Medical History:  No current facility-administered medications on file prior to encounter.   Current Outpatient Medications on File Prior to Encounter  Medication Sig Dispense Refill   albuterol  (PROVENTIL  HFA;VENTOLIN  HFA) 108 (90 Base) MCG/ACT inhaler Inhale 2 puffs into  the lungs every 4 (four) hours as needed for wheezing or shortness of breath.     ARIPiprazole  (ABILIFY ) 10 MG tablet Take 1 tablet (10 mg total) by mouth daily. 30 tablet 0   aspirin -acetaminophen -caffeine  (EXCEDRIN  MIGRAINE) 250-250-65 MG tablet Take 1-2 tablets by mouth every 6 (six) hours as needed for headache or migraine.     atomoxetine  (STRATTERA ) 25 MG capsule Take 1 capsule (25 mg total) by mouth daily. (Patient not taking: Reported on 11/07/2023) 30 capsule 0   cariprazine  (VRAYLAR ) 1.5 MG capsule Take by mouth.     FLUoxetine  (PROZAC ) 20 MG capsule Take 1 capsule (20 mg total) by mouth daily. 30 capsule 0   hydrOXYzine  (ATARAX ) 25 MG tablet Take 25 mg by mouth 3 (three) times daily as needed.     traZODone  (DESYREL ) 100 MG tablet Take 1 tablet (100 mg total) by mouth every evening. (Patient not taking: Reported on 11/07/2023) 30  tablet 0     Family History: Mother-bipolar, brother-autistic  Social History: 18 y/o female lives at home with her maternal grandmother and grandfather  Last Labs:  Office Visit on 11/07/2023  Component Date Value Ref Range Status   Preg Test, Ur 11/07/2023 Negative  Negative Final  Admission on 10/08/2023, Discharged on 10/09/2023  Component Date Value Ref Range Status   Sodium 10/08/2023 138  135 - 145 mmol/L Final   Potassium 10/08/2023 3.2 (L)  3.5 - 5.1 mmol/L Final   Chloride 10/08/2023 105  98 - 111 mmol/L Final   CO2 10/08/2023 22  22 - 32 mmol/L Final   Glucose, Bld 10/08/2023 145 (H)  70 - 99 mg/dL Final   Glucose reference range applies only to samples taken after fasting for at least 8 hours.   BUN 10/08/2023 7  6 - 20 mg/dL Final   Creatinine, Ser 10/08/2023 0.75  0.44 - 1.00 mg/dL Final   Calcium 89/72/7975 8.6 (L)  8.9 - 10.3 mg/dL Final   Total Protein 89/72/7975 6.9  6.5 - 8.1 g/dL Final   Albumin 89/72/7975 4.2  3.5 - 5.0 g/dL Final   AST 89/72/7975 22  15 - 41 U/L Final   ALT 10/08/2023 25  0 - 44 U/L Final   Alkaline Phosphatase 10/08/2023 52  38 - 126 U/L Final   Total Bilirubin 10/08/2023 1.0  0.3 - 1.2 mg/dL Final   GFR, Estimated 10/08/2023 >60  >60 mL/min Final   Comment: (NOTE) Calculated using the CKD-EPI Creatinine Equation (2021)    Anion gap 10/08/2023 11  5 - 15 Final   Performed at [facility], [address], [place], [place] [address]   Alcohol, Ethyl (B) 10/08/2023 <10  <10 mg/dL Final   Comment: (NOTE) Lowest detectable limit for serum alcohol is 10 mg/dL.  For medical purposes only. Performed at [facility], [address], [place], [place] [address]    Salicylate Lvl 10/08/2023 <7.0 (L)  7.0 - 30.0 mg/dL Final   Performed at [facility], [address] [place], [place] [address]   Acetaminophen  (Tylenol ), Serum 10/08/2023 <10 (L)  10 - 30 ug/mL Final   Comment: (NOTE) Therapeutic concentrations vary  significantly. A range of 10-30 ug/mL  may be an effective concentration for many patients. However, some  are best treated at concentrations outside of this range. Acetaminophen  concentrations >150 ug/mL at 4 hours after ingestion  and >50 ug/mL at 12 hours after ingestion are often associated with  toxic reactions.  Performed at  [facility], [address], [place], [place] [address]    WBC 10/08/2023 13.8 (H)  4.0 - 10.5 K/uL Final   RBC 10/08/2023 4.85  3.87 - 5.11 MIL/uL Final   Hemoglobin 10/08/2023 14.1  12.0 - 15.0 g/dL Final   HCT 89/72/7975 42.5  36.0 - 46.0 % Final   MCV 10/08/2023 87.6  80.0 - 100.0 fL Final   MCH 10/08/2023 29.1  26.0 - 34.0 pg Final   MCHC 10/08/2023 33.2  30.0 - 36.0 g/dL Final   RDW 89/72/7975 13.0  11.5 - 15.5 % Final   Platelets 10/08/2023 333  150 - 400 K/uL Final   nRBC 10/08/2023 0.0  0.0 - 0.2 % Final   Performed at [facility], [address], [place], [place] [address]   Opiates 10/08/2023 NONE DETECTED  NONE DETECTED Final   Cocaine 10/08/2023 NONE DETECTED  NONE DETECTED Final   Benzodiazepines 10/08/2023 NONE DETECTED  NONE DETECTED Final   Amphetamines 10/08/2023 POSITIVE (A)  NONE DETECTED Final   Tetrahydrocannabinol 10/08/2023 NONE DETECTED  NONE DETECTED Final   Barbiturates 10/08/2023 NONE DETECTED  NONE DETECTED Final   Comment: (NOTE) DRUG SCREEN FOR MEDICAL PURPOSES ONLY.  IF CONFIRMATION IS NEEDED FOR ANY PURPOSE, NOTIFY LAB WITHIN 5 DAYS.  LOWEST DETECTABLE LIMITS FOR URINE DRUG SCREEN Drug Class                     Cutoff (ng/mL) Amphetamine and metabolites    1000 Barbiturate and metabolites    200 Benzodiazepine                 200 Opiates and metabolites        300 Cocaine and metabolites        300 THC                            50 Performed at [facility], [address] [place], [place] [address]    Preg, Serum 10/08/2023 NEGATIVE  NEGATIVE Final   Comment:        THE SENSITIVITY OF  THIS METHODOLOGY IS >10 mIU/mL. Performed at [facility], [address] [place], [place] [address]    Magnesium  10/08/2023 2.0  1.7 - 2.4 mg/dL Final   Performed at [facility], [address] [place], [place] [address]   Acetaminophen  (Tylenol ), Serum 10/09/2023 <10 (L)  10 - 30 ug/mL Final   Comment: (NOTE) Therapeutic concentrations vary significantly. A range of 10-30 ug/mL  may be an effective concentration for many patients. However, some  are best treated at concentrations outside of this range. Acetaminophen  concentrations >150 ug/mL at 4 hours after ingestion  and >50 ug/mL at 12 hours after ingestion are often associated with  toxic reactions.  Performed at [facility], [address], [place], [place] [address]     Allergies: Cherry and Lactose intolerance (gi)  Medications:  PTA Medications  Medication Sig   albuterol  (PROVENTIL  HFA;VENTOLIN  HFA) 108 (90 Base) MCG/ACT inhaler Inhale 2 puffs into the lungs every 4 (four) hours as needed for wheezing or shortness of breath.   aspirin -acetaminophen -caffeine  (EXCEDRIN  MIGRAINE) 250-250-65 MG tablet Take 1-2 tablets by mouth every 6 (six) hours as needed for headache or migraine.   traZODone  (DESYREL ) 100 MG tablet Take 1 tablet (100 mg total) by mouth every evening. (Patient not taking: Reported on 11/07/2023)  FLUoxetine  (PROZAC ) 20 MG capsule Take 1 capsule (20 mg total) by mouth daily.   atomoxetine  (STRATTERA ) 25 MG capsule Take 1 capsule (25 mg total) by mouth daily. (Patient not taking: Reported on 11/07/2023)   ARIPiprazole  (ABILIFY ) 10 MG tablet Take 1 tablet (10 mg total) by mouth daily.   hydrOXYzine  (ATARAX ) 25 MG tablet Take 25 mg by mouth 3 (three) times daily as needed.   cariprazine  (VRAYLAR ) 1.5 MG capsule Take by mouth.     Medical Decision Making  NDIA SAMPATH is a 18 y.o. female with a history of DMDD, MDD, GAD, PTSD, suicidal ideations and eating disorders patient presented to  [facility] as a walk in accompanied by grandmother Debbie Long with complaints of worsening depression and suicidal ideation.    Recommendations  Based on my evaluation the patient does not appear to have an emergency medical condition. Patient recommended for inpatient treatment and will be admitted to [facility] continuous observation for crisis management, stabilization and safety.  Jamone Garrido E Brithney Bensen, NP 01/21/24  1:51 AM

## 2024-01-21 NOTE — H&P (Signed)
 Psychiatric Admission Assessment Adult  Patient Identification: Debbie Long MRN:  [id number] Date of Evaluation:  01/21/2024 Chief Complaint:  MDD (major depressive disorder), recurrent severe, without psychosis (HCC) [F33.2] Principal Diagnosis: Bipolar I disorder, most recent episode depressed (HCC) Diagnosis:  Principal Problem:   Bipolar I disorder, most recent episode depressed (HCC) Active Problems:   Self-injurious behavior   Self-induced vomiting for weight loss   Borderline personality disorder (HCC)   Cannabis abuse  History of Present Illness: 18 year old Caucasian female presents voluntarily to [facility] with her grandmother, Consuelo Keens, due to worsening depression and suicidal ideation. She reports feeling suicidal since age 39 and states she recently developed a plan to choke herself with a advertising account planner. One week ago, she attempted suicide by taking five trazodone  100 mg tablets and a bottle of wine but did not seek medical attention. She also reports a history of self-harm, including cutting her throat with a razor blade a few weeks ago.Patient states she has been drinking a bottle of wine and using marijuana (two blunts and gummies) nightly for the past week and expresses a desire to detox. She identifies multiple stressors, including her mother's recent move, a breakup, her grandfather's critical illness, and concerns about her elderly dog. She struggles with body image and reports engaging in purging behaviors, stating, Sometimes I stick my finger down my throat to throw up. I want to be skinny.Patient is currently linked to [facility] for medication management but reports inconsistent adherence. She has a pending therapy appointment with Ellouise Dawn in April 2025. She states she does not feel safe to return home. Her grandmother supports this concern and does not believe the patient will be safe if discharged. Associated Signs/Symptoms: Depression Symptoms:   insomnia, difficulty concentrating, hopelessness, suicidal thoughts with specific plan, anxiety, disturbed sleep, weight loss, decreased appetite, (Hypo) Manic Symptoms:  Distractibility, Impulsivity, Labiality of Mood, Anxiety Symptoms:  Excessive Worry, Psychotic Symptoms:   none reported PTSD Symptoms: Negative Total Time spent with patient: 2.5 hours  Past Psychiatric History: MDD   Is the patient at risk to self? Yes.    Has the patient been a risk to self in the past 6 months? Yes.    Has the patient been a risk to self within the distant past? Yes.    Is the patient a risk to others? No.  Has the patient been a risk to others in the past 6 months? No.  Has the patient been a risk to others within the distant past? No.   Columbia Scale:  Flowsheet Row ED from 01/21/2024 in [facility] ED from 10/08/2023 in [facility] Emergency Department at [facility] (Discharged[facility]  C-SSRS RISK CATEGORY High Risk High Risk High Risk        Prior Inpatient Therapy: Yes.   If yes, describe Hadassah Ok  Prior Outpatient Therapy: Yes.   If yes, describe non adherence   Alcohol Screening:   Substance Abuse History in the last 12 months:  Yes.   Consequences of Substance Abuse: Withdrawal Symptoms:   Headaches Nausea Tremors Previous Psychotropic Medications: Yes  Psychological Evaluations: Yes  Past Medical History:  Past Medical History:  Diagnosis Date   Anxiety    Asthma    Depression    Eating disorder    Migraine     Past Surgical History:  Procedure Laterality Date   TOE SURGERY  Family History: No family history on file. Family Psychiatric  History: none reported Tobacco Screening:  Social History   Tobacco Use  Smoking Status Some Days   Types: Cigarettes  Smokeless Tobacco Never    BH Tobacco Counseling     Are you interested in Tobacco  Cessation Medications?  No value filed. Counseled patient on smoking cessation:  No value filed. Reason Tobacco Screening Not Completed: No value filed.       Social History:  Social History   Substance and Sexual Activity  Alcohol Use Yes   Comment: socially     Social History   Substance and Sexual Activity  Drug Use Yes   Types: Marijuana    Additional Social History:                           Allergies:   Allergies  Allergen Reactions   Cherry Nausea And Vomiting and Other (See Comments)    Headaches and migraines   Lactose Intolerance (Gi) Diarrhea and Other (See Comments)    Major bloating   Lab Results:  Results for orders placed or performed during the hospital encounter of 01/21/24 (from the past 48 hours)  Comprehensive metabolic panel     Status: Abnormal   Collection Time: 01/21/24  3:04 AM  Result Value Ref Range   Sodium 140 135 - 145 mmol/L   Potassium 4.0 3.5 - 5.1 mmol/L   Chloride 103 98 - 111 mmol/L   CO2 23 22 - 32 mmol/L   Glucose, Bld 68 (L) 70 - 99 mg/dL    Comment: Glucose reference range applies only to samples taken after fasting for at least 8 hours.   BUN 9 6 - 20 mg/dL   Creatinine, Ser 9.24 0.44 - 1.00 mg/dL   Calcium 9.9 8.9 - 89.6 mg/dL   Total Protein 7.4 6.5 - 8.1 g/dL   Albumin 4.3 3.5 - 5.0 g/dL   AST 23 15 - 41 U/L   ALT 29 0 - 44 U/L   Alkaline Phosphatase 75 38 - 126 U/L   Total Bilirubin 1.1 0.0 - 1.2 mg/dL   GFR, Estimated >39 >39 mL/min    Comment: (NOTE) Calculated using the CKD-EPI Creatinine Equation (2021)    Anion gap 14 5 - 15    Comment: Performed at [facility], [name] [place], [place] [address]  CBC with Differential/Platelet     Status: Abnormal   Collection Time: 01/21/24  3:04 AM  Result Value Ref Range   WBC 15.8 (H) 4.0 - 10.5 K/uL   RBC 5.45 (H) 3.87 - 5.11 MIL/uL   Hemoglobin 15.7 (H) 12.0 - 15.0 g/dL   HCT 53.3 (H) 63.9 - 53.9 %   MCV 85.5 80.0 - 100.0 fL   MCH 28.8  26.0 - 34.0 pg   MCHC 33.7 30.0 - 36.0 g/dL   RDW 88.0 88.4 - 84.4 %   Platelets 458 (H) 150 - 400 K/uL   nRBC 0.0 0.0 - 0.2 %   Neutrophils Relative % 80 %   Neutro Abs 12.5 (H) 1.7 - 7.7 K/uL   Lymphocytes Relative 15 %   Lymphs Abs 2.4 0.7 - 4.0 K/uL   Monocytes Relative 4 %   Monocytes Absolute 0.7 0.1 - 1.0 K/uL   Eosinophils Relative 0 %   Eosinophils Absolute 0.1 0.0 - 0.5 K/uL   Basophils Relative 0 %   Basophils Absolute 0.1 0.0 -  0.1 K/uL   Immature Granulocytes 1 %   Abs Immature Granulocytes 0.08 (H) 0.00 - 0.07 K/uL    Comment: Performed at [facility], [address] [place], [place] [address]  Ethanol     Status: None   Collection Time: 01/21/24  3:04 AM  Result Value Ref Range   Alcohol, Ethyl (B) <10 <10 mg/dL    Comment: (NOTE) Lowest detectable limit for serum alcohol is 10 mg/dL.  For medical purposes only. Performed at [facility], [address], [place], [place] [address]   Hemoglobin A1c     Status: None   Collection Time: 01/21/24  3:04 AM  Result Value Ref Range   Hgb A1c MFr Bld 5.2 4.8 - 5.6 %    Comment: (NOTE) Pre diabetes:          5.7%-6.4%  Diabetes:              >6.4%  Glycemic control for   <7.0% adults with diabetes    Mean Plasma Glucose 102.54 mg/dL    Comment: Performed at [facility], [address], [place], [place] [address]  Lipid panel     Status: None   Collection Time: 01/21/24  3:04 AM  Result Value Ref Range   Cholesterol 151 0 - 169 mg/dL   Triglycerides 71 <849 mg/dL   HDL 49 >59 mg/dL   Total CHOL/HDL Ratio 3.1 RATIO   VLDL 14 0 - 40 mg/dL   LDL Cholesterol 88 0 - 99 mg/dL    Comment:        Total Cholesterol/HDL:CHD Risk Coronary Heart Disease Risk Table                     Men   Women  1/2 Average Risk   3.4   3.3  Average Risk       5.0   4.4  2 X Average Risk   9.6   7.1  3 X Average Risk  23.4   11.0        Use the calculated Patient Ratio above and the CHD Risk Table to  determine the patient's CHD Risk.        ATP III CLASSIFICATION (LDL):  <100     mg/dL   Optimal  899-870  mg/dL   Near or Above                    Optimal  130-159  mg/dL   Borderline  839-810  mg/dL   High  >809     mg/dL   Very High Performed at [facility], [address] [place], [place] [address]   TSH     Status: None   Collection Time: 01/21/24  3:04 AM  Result Value Ref Range   TSH 0.554 0.350 - 4.500 uIU/mL    Comment: Performed by a 3rd Generation assay with a functional sensitivity of <=0.01 uIU/mL. Performed at [facility], [address], [place], [place] [address]   POC urine preg, ED     Status: None   Collection Time: 01/21/24  3:15 AM  Result Value Ref Range   Preg Test, Ur Negative Negative  POCT Urine Drug Screen - (I-Screen)     Status: Abnormal   Collection Time: 01/21/24  3:21 AM  Result Value Ref Range   POC Amphetamine UR None Detected NONE DETECTED (Cut Off Level 1000 ng/mL)   POC  Secobarbital (BAR) None Detected NONE DETECTED (Cut Off Level 300 ng/mL)   POC Buprenorphine (BUP) None Detected NONE DETECTED (Cut Off Level 10 ng/mL)   POC Oxazepam (BZO) None Detected NONE DETECTED (Cut Off Level 300 ng/mL)   POC Cocaine UR None Detected NONE DETECTED (Cut Off Level 300 ng/mL)   POC Methamphetamine UR None Detected NONE DETECTED (Cut Off Level 1000 ng/mL)   POC Morphine None Detected NONE DETECTED (Cut Off Level 300 ng/mL)   POC Methadone UR None Detected NONE DETECTED (Cut Off Level 300 ng/mL)   POC Oxycodone UR None Detected NONE DETECTED (Cut Off Level 100 ng/mL)   POC Marijuana UR Positive (A) NONE DETECTED (Cut Off Level 50 ng/mL)    Blood Alcohol level:  Lab Results  Component Value Date   ETH <10 01/21/2024   ETH <10 10/08/2023    Metabolic Disorder Labs:  Lab Results  Component Value Date   HGBA1C 5.2 01/21/2024   MPG 102.54 01/21/2024   MPG 91.06 11/03/2018   No results found for: PROLACTIN Lab Results  Component  Value Date   CHOL 151 01/21/2024   TRIG 71 01/21/2024   HDL 49 01/21/2024   CHOLHDL 3.1 01/21/2024   VLDL 14 01/21/2024   LDLCALC 88 01/21/2024   LDLCALC 82 11/03/2018    Current Medications: Current Facility-Administered Medications  Medication Dose Route Frequency Provider Last Rate Last Admin   acetaminophen  (TYLENOL ) tablet 650 mg  650 mg Oral Q6H PRN White, Patrice L, NP       alum & mag hydroxide-simeth (MAALOX/MYLANTA) 200-200-20 MG/5ML suspension 30 mL  30 mL Oral Q4H PRN White, Patrice L, NP       cariprazine  (VRAYLAR ) capsule 1.5 mg  1.5 mg Oral Daily White, Patrice L, NP       haloperidol  (HALDOL ) tablet 5 mg  5 mg Oral TID PRN White, Patrice L, NP       And   diphenhydrAMINE  (BENADRYL ) capsule 50 mg  50 mg Oral TID PRN White, Patrice L, NP       diphenhydrAMINE  (BENADRYL ) injection 50 mg  50 mg Intramuscular TID PRN White, Patrice L, NP       FLUoxetine  (PROZAC ) capsule 40 mg  40 mg Oral Daily White, Patrice L, NP       folic acid  (FOLVITE ) tablet 1 mg  1 mg Oral Daily Nicholaus Brad RAMAN, NP       guanFACINE  (INTUNIV ) ER tablet 4 mg  4 mg Oral QHS White, Patrice L, NP       haloperidol  lactate (HALDOL ) injection 5 mg  5 mg Intramuscular TID PRN White, Patrice L, NP       hydrOXYzine  (ATARAX ) tablet 25 mg  25 mg Oral TID PRN White, Patrice L, NP       ibuprofen  (ADVIL ) tablet 400 mg  400 mg Oral Q8H PRN White, Patrice L, NP       LORazepam  (ATIVAN ) tablet 1-4 mg  1-4 mg Oral Q1H PRN Nicholaus Brad RAMAN, NP       Or   LORazepam  (ATIVAN ) injection 1-4 mg  1-4 mg Intravenous Q1H PRN Shykeria Sakamoto S, NP       LORazepam  (ATIVAN ) tablet 0-4 mg  0-4 mg Oral Q6H Nicholaus Brad RAMAN, NP       Followed by   NOREEN ON 01/23/2024] LORazepam  (ATIVAN ) tablet 0-4 mg  0-4 mg Oral Q12H Nicholaus Brad RAMAN, NP       magnesium  hydroxide (MILK OF MAGNESIA) suspension 30  mL  30 mL Oral Daily PRN White, Patrice L, NP       multivitamin with minerals tablet 1 tablet  1 tablet Oral Daily Nicholaus Brad RAMAN, NP        ondansetron  (ZOFRAN -ODT) disintegrating tablet 4 mg  4 mg Oral Q8H PRN Kenlie Seki S, NP       thiamine  (VITAMIN B1) tablet 100 mg  100 mg Oral Daily Nicholaus Brad RAMAN, NP       Or   thiamine  (VITAMIN B1) injection 100 mg  100 mg Intravenous Daily Nicholaus Brad RAMAN, NP       traZODone  (DESYREL ) tablet 50 mg  50 mg Oral QHS PRN White, Patrice L, NP       PTA Medications: Medications Prior to Admission  Medication Sig Dispense Refill Last Dose/Taking   albuterol  (PROVENTIL  HFA;VENTOLIN  HFA) 108 (90 Base) MCG/ACT inhaler Inhale 2 puffs into the lungs every 4 (four) hours as needed for wheezing or shortness of breath.      cariprazine  (VRAYLAR ) 1.5 MG capsule Take by mouth.      FLUoxetine  (PROZAC ) 20 MG capsule Take 1 capsule (20 mg total) by mouth daily. 30 capsule 0     Musculoskeletal: Strength & Muscle Tone: within normal limits Gait & Station: normal Patient leans: N/A            Psychiatric Specialty Exam:  Presentation  General Appearance:  Appropriate for Environment; Neat (appears fatigued)  Eye Contact: Minimal  Speech: Clear and Coherent; Normal Rate  Speech Volume: Normal  Handedness: Right   Mood and Affect  Mood: Depressed  Affect: Flat; Restricted; Congruent   Thought Process  Thought Processes: Coherent  Duration of Psychotic Symptoms: Depressive Symptoms 90 days Past Diagnosis of Schizophrenia or Psychoactive disorder: No  Descriptions of Associations:Intact  Orientation:Full (Time, Place and Person)  Thought Content:WDL  Hallucinations:Hallucinations: None  Ideas of Reference:None  Suicidal Thoughts:Suicidal Thoughts: Yes, Passive SI Active Intent and/or Plan: Without Intent; Without Means to Carry Out; Without Access to Means; Without Plan SI Passive Intent and/or Plan: Without Intent; Without Plan; Without Means to Carry Out; Without Access to Means  Homicidal Thoughts:Homicidal Thoughts: No   Sensorium  Memory: Immediate  Good; Recent Good; Remote Good  Judgment: Impaired  Insight: Poor   Executive Functions  Concentration: Fair  Attention Span: Fair  Recall: Good  Fund of Knowledge: Good  Language: Good   Psychomotor Activity  Psychomotor Activity: Psychomotor Activity: Normal   Assets  Assets: Social Support; Financial Resources/Insurance; Desire for Improvement; Communication Skills   Sleep  Sleep: Sleep: Fair Number of Hours of Sleep: 4    Physical Exam: Physical Exam ROS Blood pressure (!) 135/95, pulse 93, temperature 98.5 F (36.9 C), temperature source Oral, resp. rate 18, SpO2 95%. There is no height or weight on file to calculate BMI.  Treatment Plan Summary: Daily contact with patient to assess and evaluate symptoms and progress in treatment and Medication management Aripiprazole  (Abilify ) 10 mg daily - Augmentation for depressive symptoms and mood stabilization. Hydroxyzine  (Atarax ) 25 mg three times daily as needed - Anxiety management and sedation. Cariprazine  (Vraylar ) 1.5 mg daily - Mood stabilization for underlying mood disorder. Fluoxetine  (Prozac ) 40 mg daily - Treatment for major depressive disorder. Ondansetron  (Zofran ) 4 mg as needed - To manage nausea associated with anxiety and withdrawal symptoms. CIWA with Ativan  protocol - To assess and manage potential alcohol withdrawal symptoms.  Monitor for suicidal ideation and self-harm behaviors  Implement eating disorder monitoring. Observation  Level/Precautions:  Continuous Observation Detox 15 minute checks Seizure  Laboratory:  ETOH Level  Psychotherapy:    Medications:    Consultations:    Discharge Concerns:    Estimated LOS:  Other:     Physician Treatment Plan for Primary Diagnosis: Bipolar I disorder, most recent episode depressed (HCC) Long Term Goal(s): Improvement in symptoms so as ready for discharge  Short Term Goals: Ability to identify changes in lifestyle to reduce recurrence  of condition will improve, Ability to verbalize feelings will improve, Ability to disclose and discuss suicidal ideas, Ability to demonstrate self-control will improve, Ability to identify and develop effective coping behaviors will improve, Ability to maintain clinical measurements within normal limits will improve, Compliance with prescribed medications will improve, and Ability to identify triggers associated with substance abuse/mental health issues will improve  Physician Treatment Plan for Secondary Diagnosis: Principal Problem:   Bipolar I disorder, most recent episode depressed (HCC) Active Problems:   Self-injurious behavior   Self-induced vomiting for weight loss   Borderline personality disorder (HCC)   Cannabis abuse  Long Term Goal(s): Improvement in symptoms so as ready for discharge  Short Term Goals: Ability to identify changes in lifestyle to reduce recurrence of condition will improve, Ability to verbalize feelings will improve, Ability to disclose and discuss suicidal ideas, Ability to demonstrate self-control will improve, Ability to identify and develop effective coping behaviors will improve, Ability to maintain clinical measurements within normal limits will improve, Compliance with prescribed medications will improve, and Ability to identify triggers associated with substance abuse/mental health issues will improve  I certify that inpatient services furnished can reasonably be expected to improve the patient's condition.    Brad GORMAN Moats, NP 2/9/20255:34 PM

## 2024-01-21 NOTE — Plan of Care (Signed)
 New admission.  Problem: Education: Goal: Knowledge of [place] [place] [facility] Education information/materials will improve Outcome: Not Progressing Goal: Emotional status will improve Outcome: Not Progressing Goal: Mental status will improve Outcome: Not Progressing Goal: Verbalization of understanding the information provided will improve Outcome: Not Progressing   Problem: Activity: Goal: Interest or engagement in activities will improve Outcome: Not Progressing Goal: Sleeping patterns will improve Outcome: Not Progressing   Problem: Coping: Goal: Ability to verbalize frustrations and anger appropriately will improve Outcome: Not Progressing Goal: Ability to demonstrate self-control will improve Outcome: Not Progressing   Problem: Health Behavior/Discharge Planning: Goal: Identification of resources available to assist in meeting health care needs will improve Outcome: Not Progressing Goal: Compliance with treatment plan for underlying cause of condition will improve Outcome: Not Progressing   Problem: Physical Regulation: Goal: Ability to maintain clinical measurements within normal limits will improve Outcome: Not Progressing   Problem: Safety: Goal: Periods of time without injury will increase Outcome: Not Progressing   Problem: Coping: Goal: Coping ability will improve Outcome: Not Progressing Goal: Will verbalize feelings Outcome: Not Progressing   Problem: Self-Concept: Goal: Ability to identify factors that promote anxiety will improve Outcome: Not Progressing Goal: Level of anxiety will decrease Outcome: Not Progressing Goal: Ability to modify response to factors that promote anxiety will improve Outcome: Not Progressing   Problem: Physical Regulation: Goal: Complications related to the disease process, condition or treatment will be avoided or minimized Outcome: Not Progressing   Problem: Safety: Goal: Ability to remain free from injury will  improve Outcome: Not Progressing   Problem: Health Behavior/Discharge Planning: Goal: Identification of resources available to assist in meeting health care needs will improve Outcome: Not Progressing

## 2024-01-22 DIAGNOSIS — F332 Major depressive disorder, recurrent severe without psychotic features: Secondary | ICD-10-CM

## 2024-01-22 DIAGNOSIS — Z789 Other specified health status: Secondary | ICD-10-CM

## 2024-01-22 MED ORDER — CARIPRAZINE HCL 1.5 MG PO CAPS
3.0000 mg | ORAL_CAPSULE | Freq: Every day | ORAL | Status: DC
Start: 2024-01-23 — End: 2024-01-28
  Administered 2024-01-23 – 2024-01-28 (×6): 3 mg via ORAL
  Filled 2024-01-22 (×6): qty 2

## 2024-01-22 NOTE — BH IP Treatment Plan (Addendum)
 Interdisciplinary Treatment and Diagnostic Plan Update  01/22/2024 Time of Session: 09:11 Debbie Long MRN: [id number]  Principal Diagnosis: Bipolar I disorder, most recent episode depressed (HCC)  Secondary Diagnoses: Principal Problem:   Bipolar I disorder, most recent episode depressed (HCC) Active Problems:   Self-injurious behavior   Self-induced vomiting for weight loss   Borderline personality disorder (HCC)   Cannabis abuse   Current Medications:  Current Facility-Administered Medications  Medication Dose Route Frequency Provider Last Rate Last Admin   acetaminophen  (TYLENOL ) tablet 650 mg  650 mg Oral Q6H PRN White, Patrice L, NP   650 mg at 01/21/24 1809   alum & mag hydroxide-simeth (MAALOX/MYLANTA) 200-200-20 MG/5ML suspension 30 mL  30 mL Oral Q4H PRN White, Patrice L, NP       cariprazine  (VRAYLAR ) capsule 1.5 mg  1.5 mg Oral Daily White, Patrice L, NP   1.5 mg at 01/22/24 4098   haloperidol  (HALDOL ) tablet 5 mg  5 mg Oral TID PRN White, Patrice L, NP       And   diphenhydrAMINE  (BENADRYL ) capsule 50 mg  50 mg Oral TID PRN White, Patrice L, NP       diphenhydrAMINE  (BENADRYL ) injection 50 mg  50 mg Intramuscular TID PRN White, Patrice L, NP       FLUoxetine  (PROZAC ) capsule 40 mg  40 mg Oral Daily White, Patrice L, NP   40 mg at 01/22/24 0905   folic acid  (FOLVITE ) tablet 1 mg  1 mg Oral Daily Davis, Nina S, NP   1 mg at 01/22/24 1191   guanFACINE  (INTUNIV ) ER tablet 4 mg  4 mg Oral QHS White, Patrice L, NP   4 mg at 01/21/24 2125   haloperidol  lactate (HALDOL ) injection 5 mg  5 mg Intramuscular TID PRN White, Patrice L, NP       hydrOXYzine  (ATARAX ) tablet 25 mg  25 mg Oral TID PRN White, Patrice L, NP   25 mg at 01/21/24 2129   ibuprofen  (ADVIL ) tablet 400 mg  400 mg Oral Q8H PRN White, Patrice L, NP       influenza vac split trivalent PF (FLULAVAL) injection 0.5 mL  0.5 mL Intramuscular Tomorrow-1000 Silas Drivers, MD       LORazepam  (ATIVAN ) tablet 1-4 mg  1-4  mg Oral Q1H PRN Rosalene Colon, NP       Or   LORazepam  (ATIVAN ) injection 1-4 mg  1-4 mg Intravenous Q1H PRN Davis, Nina S, NP       LORazepam  (ATIVAN ) tablet 0-4 mg  0-4 mg Oral Q6H Rosalene Colon, NP   1 mg at 01/22/24 4782   Followed by   Cecily Cohen ON 01/23/2024] LORazepam  (ATIVAN ) tablet 0-4 mg  0-4 mg Oral Q12H Rosalene Colon, NP       magnesium  hydroxide (MILK OF MAGNESIA) suspension 30 mL  30 mL Oral Daily PRN White, Patrice L, NP       multivitamin with minerals tablet 1 tablet  1 tablet Oral Daily Rosalene Colon, NP   1 tablet at 01/22/24 9562   nicotine  polacrilex (NICORETTE ) gum 2 mg  2 mg Oral PRN Rosalene Colon, NP       ondansetron  (ZOFRAN -ODT) disintegrating tablet 4 mg  4 mg Oral Q8H PRN Rosalene Colon, NP   4 mg at 01/21/24 1809   thiamine  (VITAMIN B1) tablet 100 mg  100 mg Oral Daily Rosalene Colon, NP   100 mg at 01/22/24 [phone]  Or   thiamine  (VITAMIN B1) injection 100 mg  100 mg Intravenous Daily Rosalene Colon, NP       traZODone  (DESYREL ) tablet 50 mg  50 mg Oral QHS PRN White, Patrice L, NP   50 mg at 01/21/24 2123   PTA Medications: Medications Prior to Admission  Medication Sig Dispense Refill Last Dose/Taking   albuterol  (PROVENTIL  HFA;VENTOLIN  HFA) 108 (90 Base) MCG/ACT inhaler Inhale 2 puffs into the lungs every 4 (four) hours as needed for wheezing or shortness of breath.      cariprazine  (VRAYLAR ) 1.5 MG capsule Take by mouth.      FLUoxetine  (PROZAC ) 20 MG capsule Take 1 capsule (20 mg total) by mouth daily. 30 capsule 0     Patient Stressors: Health problems   Marital or family conflict   Substance abuse    Patient Strengths: Printmaker for treatment/growth  Supportive family/friends   Treatment Modalities: Medication Management, Group therapy, Case management,  1 to 1 session with clinician, Psychoeducation, Recreational therapy.   Physician Treatment Plan for Primary Diagnosis: Bipolar I disorder, most recent episode depressed  (HCC) Long Term Goal(s): Improvement in symptoms so as ready for discharge   Short Term Goals: Ability to identify changes in lifestyle to reduce recurrence of condition will improve Ability to verbalize feelings will improve Ability to disclose and discuss suicidal ideas Ability to demonstrate self-control will improve Ability to identify and develop effective coping behaviors will improve Ability to maintain clinical measurements within normal limits will improve Compliance with prescribed medications will improve Ability to identify triggers associated with substance abuse/mental health issues will improve  Medication Management: Evaluate patient's response, side effects, and tolerance of medication regimen.  Therapeutic Interventions: 1 to 1 sessions, Unit Group sessions and Medication administration.  Evaluation of Outcomes: Not Met  Physician Treatment Plan for Secondary Diagnosis: Principal Problem:   Bipolar I disorder, most recent episode depressed (HCC) Active Problems:   Self-injurious behavior   Self-induced vomiting for weight loss   Borderline personality disorder (HCC)   Cannabis abuse  Long Term Goal(s): Improvement in symptoms so as ready for discharge   Short Term Goals: Ability to identify changes in lifestyle to reduce recurrence of condition will improve Ability to verbalize feelings will improve Ability to disclose and discuss suicidal ideas Ability to demonstrate self-control will improve Ability to identify and develop effective coping behaviors will improve Ability to maintain clinical measurements within normal limits will improve Compliance with prescribed medications will improve Ability to identify triggers associated with substance abuse/mental health issues will improve     Medication Management: Evaluate patient's response, side effects, and tolerance of medication regimen.  Therapeutic Interventions: 1 to 1 sessions, Unit Group sessions and  Medication administration.  Evaluation of Outcomes: Not Met   RN Treatment Plan for Primary Diagnosis: Bipolar I disorder, most recent episode depressed (HCC) Long Term Goal(s): Knowledge of disease and therapeutic regimen to maintain health will improve  Short Term Goals: Ability to remain free from injury will improve, Ability to verbalize frustration and anger appropriately will improve, Ability to demonstrate self-control, Ability to participate in decision making will improve, Ability to verbalize feelings will improve, Ability to disclose and discuss suicidal ideas, Ability to identify and develop effective coping behaviors will improve, and Compliance with prescribed medications will improve  Medication Management: RN will administer medications as ordered by provider, will assess and evaluate patient's response and provide education to patient for prescribed medication. RN will report any adverse  and/or side effects to prescribing provider.  Therapeutic Interventions: 1 on 1 counseling sessions, Psychoeducation, Medication administration, Evaluate responses to treatment, Monitor vital signs and CBGs as ordered, Perform/monitor CIWA, COWS, AIMS and Fall Risk screenings as ordered, Perform wound care treatments as ordered.  Evaluation of Outcomes: Not Met   LCSW Treatment Plan for Primary Diagnosis: Bipolar I disorder, most recent episode depressed (HCC) Long Term Goal(s): Safe transition to appropriate next level of care at discharge, Engage patient in therapeutic group addressing interpersonal concerns.  Short Term Goals: Engage patient in aftercare planning with referrals and resources, Increase social support, Increase ability to appropriately verbalize feelings, Increase emotional regulation, Facilitate acceptance of mental health diagnosis and concerns, Facilitate patient progression through stages of change regarding substance use diagnoses and concerns, Identify triggers associated  with mental health/substance abuse issues, and Increase skills for wellness and recovery  Therapeutic Interventions: Assess for all discharge needs, 1 to 1 time with Social worker, Explore available resources and support systems, Assess for adequacy in community support network, Educate family and significant other(s) on suicide prevention, Complete Psychosocial Assessment, Interpersonal group therapy.  Evaluation of Outcomes: Not Met   Progress in Treatment: Attending groups: No. Participating in groups: No. Taking medication as prescribed: Yes. Toleration medication: Yes. Family/Significant other contact made: No, will contact:  when given permission. Patient understands diagnosis: Yes. Discussing patient identified problems/goals with staff: Yes. Medical problems stabilized or resolved: Yes. Denies suicidal/homicidal ideation: Yes. Issues/concerns per patient self-inventory: No. Other: none.  New problem(s) identified: No, Describe:  none identified.  New Short Term/Long Term Goal(s): detox, medication management for mood stabilization; elimination of SI thoughts; development of comprehensive mental wellness/sobriety plan.  Patient Goals:  "Learn how to control myself." Pt also expressed desire to stay safe, learn to better regulate her emotions, and help with substance use.  Discharge Plan or Barriers: CSW will assist with development of an appropriate aftercare/discharge plan.   Reason for Continuation of Hospitalization: Anxiety Depression Medication stabilization Suicidal ideation  Estimated Length of Stay: 1-7 days  Last 3 [place] Suicide Severity Risk Score: Flowsheet Row Admission (Current) from 01/21/2024 in [facility] INPATIENT BEHAVIORAL MEDICINE Most recent reading at 01/21/2024  6:45 PM ED from 01/21/2024 in [facility] Most recent reading at 01/21/2024  3:26 AM ED from 10/08/2023 in [facility] Emergency Department at [facility] Most  recent reading at 10/08/2023 11:02 PM  C-SSRS RISK CATEGORY High Risk High Risk High Risk       Last PHQ 2/9 Scores:     No data to display          Scribe for Treatment Team: Randolm Butte, Buzz Cass 01/22/2024 9:31 AM

## 2024-01-22 NOTE — Group Note (Signed)
 Recreation Therapy Group Note   Group Topic:Relaxation  Group Date: 01/22/2024 Start Time: 1530 End Time: 1610 Facilitators: Yvonna Herder, CTRS Location:  Craft Room  Group Description: Meditation. LRT and patients discussed what they know about meditation and mindfulness. LRT played a Deep Breathing Meditation exercise script for patients to follow along to. LRT and patients discussed how meditation and deep breathing can be used as a coping skill post--discharge to help manage symptoms of stress.   Goal Area(s) Addressed: Patient will practice using relaxation technique. Patient will identify a new coping skill.  Patient will follow multistep directions to reduce anxiety and stress.   Affect/Mood: N/A   Participation Level: Did not attend    Clinical Observations/Individualized Feedback: Patient did not attend group.  Plan: Continue to engage patient in RT group sessions 2-3x/week.   Deatrice Factor, LRT, CTRS 01/22/2024 5:07 PM

## 2024-01-22 NOTE — Group Note (Signed)
 [facility] LCSW Group Therapy Note    Group Date: 01/22/2024 Start Time: 1300 End Time: 1345  Type of Therapy and Topic:  Group Therapy:  Overcoming Obstacles  Participation Level:  [facility] PARTICIPATION LEVEL: Did Not Attend  Description of Group:   In this group patients will be encouraged to explore what they see as obstacles to their own wellness and recovery. They will be guided to discuss their thoughts, feelings, and behaviors related to these obstacles. The group will process together ways to cope with barriers, with attention given to specific choices patients can make. Each patient will be challenged to identify changes they are motivated to make in order to overcome their obstacles. This group will be process-oriented, with patients participating in exploration of their own experiences as well as giving and receiving support and challenge from other group members.  Therapeutic Goals: 1. Patient will identify personal and current obstacles as they relate to admission. 2. Patient will identify barriers that currently interfere with their wellness or overcoming obstacles.  3. Patient will identify feelings, thought process and behaviors related to these barriers. 4. Patient will identify two changes they are willing to make to overcome these obstacles:    Summary of Patient Progress X   Therapeutic Modalities:   Cognitive Behavioral Therapy Solution Focused Therapy Motivational Interviewing Relapse Prevention Therapy   Randolm Butte, LCSW

## 2024-01-22 NOTE — Group Note (Signed)
 Date:  01/22/2024 Time:  10:01 AM  Group Topic/Focus:  Goals Group:   The focus of this group is to help patients establish daily goals to achieve during treatment and discuss how the patient can incorporate goal setting into their daily lives to aide in recovery. Wellness Toolbox:   The focus of this group is to discuss various aspects of wellness, balancing those aspects and exploring ways to increase the ability to experience wellness.  Patients will create a wellness toolbox for use upon discharge.    Participation Level:  Active  Participation Quality:  Appropriate and Attentive  Affect:  Appropriate  Cognitive:  Appropriate  Insight: Appropriate  Engagement in Group:  Developing/Improving and Engaged  Modes of Intervention:  Activity and Discussion  Additional Comments:    Deaira Leckey 01/22/2024, 10:01 AM
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
## 2024-01-22 NOTE — Group Note (Signed)
 Recreation Therapy Group Note   Group Topic:Coping Skills  Group Date: 01/22/2024 Start Time: 1000 End Time: 1100 Facilitators: Deatrice Factor, LRT, CTRS Location:  Craft Room  Group Description: Mind Map.  Patient was provided a blank template of a diagram with 32 blank boxes in a tiered system, branching from the center (similar to a bubble chart). LRT directed patients to label the middle of the diagram "Coping Skills". LRT and patients then came up with 8 different coping skills as examples. Pt were directed to record their coping skills in the 2nd tier boxes closest to the center.  Patients would then share their coping skills with the group as LRT wrote them out. LRT gave a handout of 99 different coping skills at the end of group.   Goal Area(s) Addressed: Patients will be able to define "coping skills". Patient will identify new coping skills.  Patient will increase communication.   Affect/Mood: Appropriate and Flat   Participation Level: Active and Engaged   Participation Quality: Independent   Behavior: Appropriate, Calm, and Cooperative   Speech/Thought Process: Coherent   Insight: Good   Judgement: Good   Modes of Intervention: Clarification, Education, Guided Discussion, Open Conversation, and Worksheet   Patient Response to Interventions:  Attentive and Receptive   Education Outcome:  Acknowledges education   Clinical Observations/Individualized Feedback: Debbie Long was active in their participation of session activities and group discussion. Pt identified "aromatherapy, meditation, listen to rain sounds, and do puzzles" as coping skills. Pt came late to group after meeting with social work, however, joined with no issue. Pt spontaneously contributed to group discussion while in group. Pt minimally interacted with peers.    Plan: Continue to engage patient in RT group sessions 2-3x/week.   Deatrice Factor, LRT, CTRS 01/22/2024 11:55 AM

## 2024-01-22 NOTE — Plan of Care (Signed)
   Problem: Education: Goal: Emotional status will improve Outcome: Progressing Goal: Mental status will improve Outcome: Progressing Goal: Verbalization of understanding the information provided will improve Outcome: Progressing

## 2024-01-22 NOTE — [facility] Suicide Risk Assessment (Signed)
 [facility] INPATIENT:  Family/Significant Other Suicide Prevention Education  Suicide Prevention Education:  Contact Attempts: Karlynn Oyster, grandmother, [phone], (name of family member/significant other) has been identified by the patient as the family member/significant other with whom the patient will be residing, and identified as the person(s) who will aid the patient in the event of a mental health crisis.  With written consent from the patient, two attempts were made to provide suicide prevention education, prior to and/or following the patient's discharge.  We were unsuccessful in providing suicide prevention education.  A suicide education pamphlet was given to the patient to share with family/significant other.  Date and time of first attempt: 01/22/2024 at 2:25PM Date and time of second attempt: Second attempt is needed  CSW left HIPAA compliant voicemail.  Debbie Long 01/22/2024, 2:24 PM

## 2024-01-22 NOTE — Progress Notes (Signed)
   01/22/24 0905  Psych Admission Type (Psych Patients Only)  Admission Status Voluntary  Psychosocial Assessment  Patient Complaints Anxiety;Depression  Eye Contact Fair  Facial Expression Animated  Affect Anxious  Speech Logical/coherent  Interaction Assertive  Motor Activity Other (Comment) (WDL)  Appearance/Hygiene Unremarkable  Behavior Characteristics Cooperative;Appropriate to situation  Mood Depressed;Anxious  Thought Process  Coherency Circumstantial  Content WDL  Delusions None reported or observed  Perception WDL  Hallucination None reported or observed  Judgment Poor  Confusion None  Danger to Self  Current suicidal ideation? Denies  Agreement Not to Harm Self Yes  Description of Agreement Verbal  Danger to Others  Danger to Others None reported or observed
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
## 2024-01-22 NOTE — Progress Notes (Signed)
 Patient engaged lying in bed, c/o headache, depression rated severe, anxiety rated moderated, expressed hopelessness and was able to contract for safety.  CIWA score 5, ativan  given as scheduled,  Pt's reported stressors include "my mother moving to [place]  to marry her boyfriend, breakup with my girlfriend, and my grandpa and dog died."  OOB for medication when prompted,, did not attend wrapup group.

## 2024-01-22 NOTE — Group Note (Signed)
 Date:  01/22/2024 Time:  10:33 PM  Group Topic/Focus:  Orientation:   The focus of this group is to educate the patient on the purpose and policies of crisis stabilization and provide a format to answer questions about their admission.  The group details unit policies and expectations of patients while admitted.    Participation Level:  Did Not Attend  Participation Quality:   none  Affect:   none  Cognitive:   none  Insight: None  Engagement in Group:   none  Modes of Intervention:   none  Additional Comments:  none   Iceis Knab 01/22/2024, 10:33 PM

## 2024-01-23 NOTE — Group Note (Signed)
LCSW Group Therapy Note   Group Date: 01/23/2024 Start Time: 1300 End Time: 1407   Type of Therapy and Topic:  Group Therapy: Challenging Core Beliefs  Participation Level:  Did Not Attend  Description of Group:  Patients were educated about core beliefs and asked to identify one harmful core belief that they have. Patients were asked to explore from where those beliefs originate. Patients were asked to discuss how those beliefs make them feel and the resulting behaviors of those beliefs. They were then be asked if those beliefs are true and, if so, what evidence they have to support them. Lastly, group members were challenged to replace those negative core beliefs with helpful beliefs.   Therapeutic Goals:   1. Patient will identify harmful core beliefs and explore the origins of such beliefs. 2. Patient will identify feelings and behaviors that result from those core beliefs. 3. Patient will discuss whether such beliefs are true. 4.  Patient will replace harmful core beliefs with helpful ones.  Summary of Patient Progress:  Patient did not attend group.    Therapeutic Modalities: Cognitive Behavioral Therapy; Solution-Focused Therapy   Lowry Ram, LCSWA 01/23/2024  2:10 PM

## 2024-01-23 NOTE — Group Note (Signed)
Recreation Therapy Group Note   Group Topic:Goal Setting  Group Date: 01/23/2024 Start Time: 1000 End Time: 1100 Facilitators: Rosina Lowenstein, LRT, CTRS Location:  Craft Room  Group Description: Product/process development scientist. Patients were given many different magazines, a glue stick, markers, and a piece of cardstock paper. LRT and pts discussed the importance of having goals in life. LRT and pts discussed the difference between short-term and long-term goals, as well as what a SMART goal is. LRT encouraged pts to create a vision board, with images they picked and then cut out with safety scissors from the magazine, for themselves, that capture their short and long-term goals. LRT encouraged pts to show and explain their vision board to the group.   Goal Area(s) Addressed:  Patient will gain knowledge of short vs. long term goals.  Patient will identify goals for themselves. Patient will practice setting SMART goals. Patient will verbalize their goals to LRT and peers.   Affect/Mood: Appropriate   Participation Level: Active and Engaged   Participation Quality: Independent   Behavior: Appropriate, Calm, and Cooperative   Speech/Thought Process: Coherent   Insight: Good   Judgement: Good   Modes of Intervention: Art, Exploration, Guided Discussion, Open Conversation, and Support   Patient Response to Interventions:  Attentive, Engaged, Interested , and Receptive   Education Outcome:  Acknowledges education   Clinical Observations/Individualized Feedback: Deatrice was active in their participation of session activities and group discussion. Pt identified "I want to quit smoking, learn how to bake, and get some plants" as her goals. Pt appropriately identified images to refect these goals. Pt asked to have her vision board laminated. Pt interacted well with LRT and peers duration of session.    Plan: Continue to engage patient in RT group sessions 2-3x/week.   Rosina Lowenstein, LRT,  CTRS 01/23/2024 11:52 AM

## 2024-01-23 NOTE — Plan of Care (Signed)
Problem: Education: Goal: Emotional status will improve Outcome: Progressing Goal: Mental status will improve Outcome: Progressing Goal: Verbalization of understanding the information provided will improve Outcome: Progressing

## 2024-01-23 NOTE — Progress Notes (Signed)
   01/23/24 0600  15 Minute Checks  Location Bedroom  Visual Appearance Calm  Behavior Sleeping  Sleep (Behavioral Health Patients Only)  Calculate sleep? (Click Yes once per 24 hr at 0600 safety check) Yes  Documented sleep last 24 hours 15.75

## 2024-01-23 NOTE — Group Note (Signed)
Date:  01/23/2024 Time:  9:10 PM  Group Topic/Focus:  Wrap-Up Group:   The focus of this group is to help patients review their daily goal of treatment and discuss progress on daily workbooks.    Participation Level:  Active  Participation Quality:  Appropriate  Affect:  Flat  Cognitive:  Appropriate  Insight: Appropriate  Engagement in Group:  Engaged and Limited  Modes of Intervention:  Discussion  Additional Comments:     Belva Crome 01/23/2024, 9:10 PM

## 2024-01-23 NOTE — Group Note (Signed)
Recreation Therapy Group Note   Group Topic:Coping Skills  Group Date: 01/23/2024 Start Time: 1530 End Time: 1615 Facilitators: Rosina Lowenstein, LRT, CTRS Location:  Dayroom  Group Description: Coping A-Z. LRT and patients engage in a guided discussion on what coping skills are and gave specific examples. LRT passed out a handout labeled Coping A-Z with blank spaces beside each letter. LRT prompted patients to come up with a coping skill for each of the letters. LRT and patients went over the handout and gave ideas for each letter if anyone had any blanks left on their paper. Patients kept this handout with them that listed 26 different coping skills.   Goal Area(s) Addressed: Patients will be able to define "coping skills". Patient will identify new coping skills.  Patient will increase communication.   Affect/Mood: N/A   Participation Level: Did not attend    Clinical Observations/Individualized Feedback: Patient did not attend group.   Plan: Continue to engage patient in RT group sessions 2-3x/week.   Rosina Lowenstein, LRT, CTRS 01/23/2024 5:33 PM

## 2024-01-23 NOTE — Progress Notes (Signed)
   01/23/24 0936  Psych Admission Type (Psych Patients Only)  Admission Status Voluntary  Psychosocial Assessment  Patient Complaints Anxiety;Depression  Eye Contact Fair  Facial Expression Animated  Affect Anxious  Speech Logical/coherent  Interaction Assertive  Motor Activity Other (Comment) (WDL)  Appearance/Hygiene Unremarkable  Behavior Characteristics Cooperative;Appropriate to situation  Mood Depressed;Anxious  Thought Process  Coherency Circumstantial  Content WDL  Delusions None reported or observed  Perception WDL  Hallucination None reported or observed  Judgment Poor  Confusion None  Danger to Self  Current suicidal ideation? Denies  Agreement Not to Harm Self Yes  Description of Agreement Verbal  Danger to Others  Danger to Others None reported or observed

## 2024-01-23 NOTE — Plan of Care (Signed)
  Problem: Education: Goal: Knowledge of [facility] Education information/materials will improve Outcome: Progressing Goal: Emotional status will improve Outcome: Progressing Goal: Mental status will improve Outcome: Progressing Goal: Verbalization of understanding the information provided will improve Outcome: Progressing   Problem: Activity: Goal: Interest or engagement in activities will improve Outcome: Progressing Goal: Sleeping patterns will improve Outcome: Progressing   Problem: Coping: Goal: Ability to verbalize frustrations and anger appropriately will improve Outcome: Progressing Goal: Ability to demonstrate self-control will improve Outcome: Progressing   Problem: Health Behavior/Discharge Planning: Goal: Identification of resources available to assist in meeting health care needs will improve Outcome: Progressing Goal: Compliance with treatment plan for underlying cause of condition will improve Outcome: Progressing   Problem: Physical Regulation: Goal: Ability to maintain clinical measurements within normal limits will improve Outcome: Progressing   Problem: Safety: Goal: Periods of time without injury will increase Outcome: Progressing   Problem: Coping: Goal: Coping ability will improve Outcome: Progressing Goal: Will verbalize feelings Outcome: Progressing   Problem: Self-Concept: Goal: Ability to identify factors that promote anxiety will improve Outcome: Progressing Goal: Level of anxiety will decrease Outcome: Progressing Goal: Ability to modify response to factors that promote anxiety will improve Outcome: Progressing   Problem: Physical Regulation: Goal: Complications related to the disease process, condition or treatment will be avoided or minimized Outcome: Progressing   Problem: Safety: Goal: Ability to remain free from injury will improve Outcome: Progressing   Problem: Health Behavior/Discharge Planning: Goal:  Identification of resources available to assist in meeting health care needs will improve Outcome: Progressing

## 2024-01-23 NOTE — Progress Notes (Signed)
   01/22/24 2220  Psych Admission Type (Psych Patients Only)  Admission Status Voluntary  Psychosocial Assessment  Patient Complaints Anxiety;Depression  Eye Contact Fair  Facial Expression Animated  Affect Anxious  Speech Logical/coherent  Interaction Assertive  Motor Activity Slow  Appearance/Hygiene Unremarkable  Behavior Characteristics Cooperative;Appropriate to situation  Mood Depressed  Aggressive Behavior  Effect No apparent injury  Thought Process  Coherency Circumstantial  Content WDL  Delusions None reported or observed  Perception WDL  Hallucination None reported or observed  Judgment Poor  Confusion None  Danger to Self  Current suicidal ideation? Denies  Self-Injurious Behavior No self-injurious ideation or behavior indicators observed or expressed   Agreement Not to Harm Self Yes  Description of Agreement Verbal  Danger to Others  Danger to Others None reported or observed

## 2024-01-24 MED ORDER — POLYETHYLENE GLYCOL 3350 17 G PO PACK
17.0000 g | PACK | Freq: Every day | ORAL | Status: DC
  Administered 2024-01-25 – 2024-01-27 (×3): 17 g via ORAL
  Filled 2024-01-24 (×4): qty 1

## 2024-01-24 NOTE — Progress Notes (Signed)
[facility] MD Progress Note  01/23/2024 1510 Debbie Long  MRN:  [id number]  18 year old Caucasian female presents voluntarily to [facility] with her grandmother, [name], due to worsening depression and suicidal ideation. She reports feeling suicidal since age 19 and states she recently developed a plan to choke herself with a [employer] account planner. One week ago, she attempted suicide by taking five trazodone 100 mg tablets and a bottle of wine but did not seek medical attention. She also reports a history of self-harm, including cutting her throat with a razor blade a few weeks ago.  Subjective:  Chart reviewed, case discussed with multidisciplinary team, patient seen during rounds today. Patient reports she is feeling "okay" today. She reports decreased frequency of suicidal thoughts. No intent or plan. Her appetite has improved. Reports frequent awakening overnight, which she describes as "typical". Was able to resume sleep without issue. No nightmares reported. She is adherent with psychotropic medications. Vraylar was increased to target mood instability and she is tolerating well. Denies side effects.  Discussed discharge plans. Patient does not have a therapist and is established with medication management provider. Discussed recommendation for DBT and she is in agreement, open to following up at [facility].   Diagnosis: Principal Problem:   MDD (major depressive disorder), recurrent severe, without psychosis (HCC) Active Problems:   PTSD (post-traumatic stress disorder)   Self-injurious behavior   Self-induced vomiting for weight loss   Borderline personality disorder (HCC)   Cannabis abuse   Alcohol use  Total Time spent with patient: 20 minutes  Past Psychiatric History: History of PTSD, DMDD, MDD and ADHD. Patient hospitalized at [facility] 07/10/23. Two intentional overdoses in the past 9 months. Past trial of Abilify.  Past Medical History:  Past Medical History:  Diagnosis Date   Anxiety     Asthma    Depression    Eating disorder    Migraine     Past Surgical History:  Procedure Laterality Date   TOE SURGERY     Family History: History reviewed. No pertinent family history. Family Psychiatric  History: Mother- bipolar disorder; brother- autism  Social History:  Social History   Substance and Sexual Activity  Alcohol Use Yes   Comment: socially     Social History   Substance and Sexual Activity  Drug Use Yes   Types: Marijuana    Social History   Socioeconomic History   Marital status: Single    Spouse name: Not on file   Number of children: Not on file   Years of education: Not on file   Highest education level: Not on file  Occupational History   Not on file  Tobacco Use   Smoking status: Some Days    Types: Cigarettes   Smokeless tobacco: Never  Vaping Use   Vaping status: Former  Substance and Sexual Activity   Alcohol use: Yes    Comment: socially   Drug use: Yes    Types: Marijuana   Sexual activity: Yes  Other Topics Concern   Not on file  Social History Narrative   Not on file   Social Drivers of Health   Financial Resource Strain: Low Risk  (08/25/2023)   Received from [facility]   Overall Financial Resource Strain (CARDIA)    Difficulty of Paying Living Expenses: Not hard at all  Food Insecurity: No Food Insecurity (01/21/2024)   Hunger Vital Sign    Worried About Running Out of Food in the Last Year: Never true  Ran Out of Food in the Last Year: Never true  Transportation Needs: No Transportation Needs (01/21/2024)   PRAPARE - Administrator, Civil Service (Medical): No    Lack of Transportation (Non-Medical): No  Physical Activity: Not on file  Stress: Not on file  Social Connections: Unknown (04/25/2022)   Received from [facility]   Social Network    Social Network: Not on file   Social History: Patient resides with her (m) grandparents. Patient states that she graduated from high school in December 2024.  She is unemployed.  Sleep: Fair  Appetite:  Poor  Current Medications: Current Facility-Administered Medications  Medication Dose Route Frequency Provider Last Rate Last Admin   acetaminophen (TYLENOL) tablet 650 mg  650 mg Oral Q6H PRN White, Patrice L, NP   650 mg at 01/24/24 0953   alum & mag hydroxide-simeth (MAALOX/MYLANTA) 200-200-20 MG/5ML suspension 30 mL  30 mL Oral Q4H PRN White, Patrice L, NP       cariprazine (VRAYLAR) capsule 3 mg  3 mg Oral Daily Marlin Jarrard E, NP   3 mg at 01/24/24 0955   haloperidol (HALDOL) tablet 5 mg  5 mg Oral TID PRN White, Patrice L, NP       And   diphenhydrAMINE (BENADRYL) capsule 50 mg  50 mg Oral TID PRN White, Patrice L, NP       diphenhydrAMINE (BENADRYL) injection 50 mg  50 mg Intramuscular TID PRN White, Patrice L, NP       FLUoxetine (PROZAC) capsule 40 mg  40 mg Oral Daily White, Patrice L, NP   40 mg at 01/24/24 0954   folic acid (FOLVITE) tablet 1 mg  1 mg Oral Daily Myriam Forehand, NP   1 mg at 01/24/24 0954   guanFACINE (INTUNIV) ER tablet 4 mg  4 mg Oral QHS White, Patrice L, NP   4 mg at 01/23/24 2109   haloperidol lactate (HALDOL) injection 5 mg  5 mg Intramuscular TID PRN Liborio Nixon L, NP       hydrOXYzine (ATARAX) tablet 25 mg  25 mg Oral TID PRN White, Patrice L, NP   25 mg at 01/23/24 2108   ibuprofen (ADVIL) tablet 400 mg  400 mg Oral Q8H PRN White, Patrice L, NP       LORazepam (ATIVAN) tablet 1-4 mg  1-4 mg Oral Q1H PRN Myriam Forehand, NP       Or   LORazepam (ATIVAN) injection 1-4 mg  1-4 mg Intravenous Q1H PRN Myriam Forehand, NP       LORazepam (ATIVAN) tablet 0-4 mg  0-4 mg Oral Q12H Myriam Forehand, NP       magnesium hydroxide (MILK OF MAGNESIA) suspension 30 mL  30 mL Oral Daily PRN White, Patrice L, NP       multivitamin with minerals tablet 1 tablet  1 tablet Oral Daily Myriam Forehand, NP   1 tablet at 01/24/24 1610   nicotine polacrilex (NICORETTE) gum 2 mg  2 mg Oral PRN Myriam Forehand, NP       ondansetron  (ZOFRAN-ODT) disintegrating tablet 4 mg  4 mg Oral Q8H PRN Myriam Forehand, NP   4 mg at 01/21/24 1809   thiamine (VITAMIN B1) tablet 100 mg  100 mg Oral Daily Myriam Forehand, NP   100 mg at 01/24/24 9604   Or   thiamine (VITAMIN B1) injection 100 mg  100 mg Intravenous Daily Myriam Forehand,  NP       traZODone (DESYREL) tablet 50 mg  50 mg Oral QHS PRN White, Patrice L, NP   50 mg at 01/23/24 2108    Lab Results:  No results found for this or any previous visit (from the past 48 hours).   Blood Alcohol level:  Lab Results  Component Value Date   ETH <10 01/21/2024   ETH <10 01/21/2024    Metabolic Disorder Labs: Lab Results  Component Value Date   HGBA1C 5.2 01/21/2024   MPG 102.54 01/21/2024   MPG 91.06 11/03/2018    Lab Results  Component Value Date   CHOL 151 01/21/2024   TRIG 71 01/21/2024   HDL 49 01/21/2024   CHOLHDL 3.1 01/21/2024   VLDL 14 01/21/2024   LDLCALC 88 01/21/2024   LDLCALC 82 11/03/2018    Physical Findings: AIMS: Facial and Oral Movements Muscles of Facial Expression: None Lips and Perioral Area: None Jaw: None Tongue: None,Extremity Movements Upper (arms, wrists, hands, fingers): None Lower (legs, knees, ankles, toes): None, Trunk Movements Neck, shoulders, hips: None, Global Judgements Severity of abnormal movements overall : None Incapacitation due to abnormal movements: None Patient's awareness of abnormal movements: No Awareness,    CIWA:  CIWA-Ar Total: 2 COWS:     Musculoskeletal: Strength & Muscle Tone: within normal limits Gait & Station: normal Patient leans: N/A  Psychiatric Specialty Exam:  Presentation  General Appearance:  Appropriate for Environment  Eye Contact: Fair  Speech: Clear and Coherent; Normal Rate  Speech Volume: Normal  Handedness: Right   Mood and Affect  Mood: Depressed  Affect: Flat; Congruent   Thought Process  Thought Processes: Coherent  Descriptions of  Associations:Intact  Orientation:Full (Time, Place and Person)  Thought Content:Logical  History of Schizophrenia/Schizoaffective disorder:No  Duration of Psychotic Symptoms:N/A  Hallucinations:None  Ideas of Reference:None  Suicidal Thoughts:No  Homicidal Thoughts:No   Sensorium  Memory: Immediate Good; Recent Good; Remote Good  Judgment: Fair  Insight: Fair   Art therapist  Concentration: Fair  Attention Span: Fair  Recall: Good  Fund of Knowledge: Good  Language: Good   Psychomotor Activity  Psychomotor Activity: Normal   Assets  Assets: Housing; Physical Health; Social Support; Communication Skills   Sleep  Sleep: Fair    Physical Exam: Physical Exam Vitals and nursing note reviewed.  HENT:     Head: Normocephalic and atraumatic.     Nose: Nose normal.     Mouth/Throat:     Mouth: Mucous membranes are moist.  Eyes:     Extraocular Movements: Extraocular movements intact.  Pulmonary:     Effort: Pulmonary effort is normal.  Musculoskeletal:        General: Normal range of motion.     Cervical back: Normal range of motion.  Skin:    General: Skin is warm and dry.  Neurological:     Mental Status: She is alert and oriented to person, place, and time.  Psychiatric:        Attention and Perception: Attention and perception normal.        Mood and Affect: Affect normal. Mood is depressed.        Speech: Speech normal.        Behavior: Behavior is withdrawn. Behavior is cooperative.        Thought Content: Thought content includes suicidal ideation.        Cognition and Memory: Cognition and memory normal.        Judgment: Judgment is impulsive.  Review of Systems  Psychiatric/Behavioral:  Positive for depression and substance abuse. Negative for hallucinations and suicidal ideas. The patient is nervous/anxious. The patient does not have insomnia.   All other systems reviewed and are negative.  Blood pressure (!)  89/55, pulse (!) 57, temperature 98.8 F (37.1 C), resp. rate 20, height 5\' 6"  (1.676 m), weight 71.7 kg, SpO2 98%. Body mass index is 25.5 kg/m.  PLAN:  Principal Problem:   MDD (major depressive disorder), recurrent severe, without psychosis (HCC) Active Problems:   PTSD (post-traumatic stress disorder)   Self-injurious behavior   Self-induced vomiting for weight loss   Borderline personality disorder (HCC)   Cannabis abuse   Alcohol use  1.    Safety and Monitoring:              --  Voluntary admission to inpatient psychiatric unit for safety, stabilization and treatment             -- Daily contact with patient to assess and evaluate symptoms and progress in treatment             -- Patient's case to be discussed in multi-disciplinary team meeting             -- Observation Level : q15 minute checks             -- Vital signs:  q6h x 48 hours, then q12 hours             -- Precautions: suicide  2. Psychiatric Diagnoses and Treatment:   -- Continue Prozac 40mg  PO daily for MDD and PTSD  -- Continue Intuniv 4mg  PO at bedtime for ADHD  -- Continue Vraylar 3mg  PO daily for MDD adjunct  -- Continue hydroxyzine 25mg  PO TID PRN anxiety  -- Continue trazodone 50mg  PO at bedtime PRN insomnia  -- Hold Strattera 25mg  (unavailable)  -- CIWA monitoring and Ativan taper for alcohol withdrawal  -- Restrict patient access to room without staff supervision during meal times and group therapy, monitor for self-induced vomiting --  The risks/benefits/side-effects/alternatives to this medication were discussed in detail with the patient and time was given for questions. The patient consents to medication trial.             -- Metabolic profile and EKG monitoring obtained while on an atypical antipsychotic  (BMI: 25.5;  Lipid Panel: WNL; HbgA1c: 5.2; QTc: 433)             -- Encouraged patient to participate in unit milieu and in scheduled group therapies             -- Short Term Goals:  Ability to identify changes in lifestyle to reduce recurrence of condition will improve, Ability to verbalize feelings will improve, Ability to disclose and discuss suicidal ideas, Ability to demonstrate self-control will improve, Ability to identify and develop effective coping behaviors will improve, Ability to maintain clinical measurements within normal limits will improve, Compliance with prescribed medications will improve, and Ability to identify triggers associated with substance abuse/mental health issues will improve             -- Long Term Goals: Improvement in symptoms so as ready for discharge  3. Medical Issues Being Addressed: None at this time  4. Discharge Planning:             -- Social work and case management to assist with discharge planning and identification of hospital follow-up needs prior to discharge             --  Estimated LOS: 5-7 days             -- Discharge Concerns: Need to establish a safety plan; Medication compliance and effectiveness             -- Discharge Goals: Return home with outpatient referrals for mental health follow-up including medication management/psychotherapy  Ladislav Caselli E Jupiter Kabir, NP 01/24/2024, 2:13 PM

## 2024-01-24 NOTE — Plan of Care (Signed)
  Problem: Education: Goal: Knowledge of [facility] Education information/materials will improve Outcome: Progressing Goal: Emotional status will improve Outcome: Progressing Goal: Mental status will improve Outcome: Progressing Goal: Verbalization of understanding the information provided will improve Outcome: Progressing   Problem: Activity: Goal: Interest or engagement in activities will improve Outcome: Progressing Goal: Sleeping patterns will improve Outcome: Progressing   Problem: Coping: Goal: Ability to verbalize frustrations and anger appropriately will improve Outcome: Progressing Goal: Ability to demonstrate self-control will improve Outcome: Progressing   Problem: Health Behavior/Discharge Planning: Goal: Identification of resources available to assist in meeting health care needs will improve Outcome: Progressing Goal: Compliance with treatment plan for underlying cause of condition will improve Outcome: Progressing   Problem: Physical Regulation: Goal: Ability to maintain clinical measurements within normal limits will improve Outcome: Progressing   Problem: Safety: Goal: Periods of time without injury will increase Outcome: Progressing   Problem: Coping: Goal: Coping ability will improve Outcome: Progressing Goal: Will verbalize feelings Outcome: Progressing   Problem: Self-Concept: Goal: Ability to identify factors that promote anxiety will improve Outcome: Progressing Goal: Level of anxiety will decrease Outcome: Progressing Goal: Ability to modify response to factors that promote anxiety will improve Outcome: Progressing   Problem: Physical Regulation: Goal: Complications related to the disease process, condition or treatment will be avoided or minimized Outcome: Progressing   Problem: Safety: Goal: Ability to remain free from injury will improve Outcome: Progressing   Problem: Health Behavior/Discharge Planning: Goal:  Identification of resources available to assist in meeting health care needs will improve Outcome: Progressing

## 2024-01-24 NOTE — Plan of Care (Signed)
Problem: Education: Goal: Knowledge of [facility] Education information/materials will improve Outcome: Progressing Goal: Verbalization of understanding the information provided will improve Outcome: Progressing

## 2024-01-24 NOTE — Group Note (Signed)
Date:  01/24/2024 Time:  10:16 AM  Group Topic/Focus:  Goals Group:   The focus of this group is to help patients establish daily goals to achieve during treatment and discuss how the patient can incorporate goal setting into their daily lives to aide in recovery. Overcoming Stress:   The focus of this group is to define stress and help patients assess their triggers.    Participation Level:  Active  Participation Quality:  Appropriate  Affect:  Appropriate  Cognitive:  Alert, Appropriate, and Oriented  Insight: Appropriate  Engagement in Group:  Developing/Improving and Engaged  Modes of Intervention:  Activity, Discussion, and Education  Additional Comments:    [name] 01/24/2024, 10:16 AM

## 2024-01-24 NOTE — Group Note (Signed)
Date:  01/24/2024 Time:  7:07 PM  Group Topic/Focus:  Healthy Communication:   The focus of this group is to discuss communication, barriers to communication, as well as healthy ways to communicate with others. Rediscovering Joy:   The focus of this group is to explore various ways to relieve stress in a positive manner.    Participation Level:  Did Not Attend   Debbie Long 01/24/2024, 7:07 PM

## 2024-01-24 NOTE — Progress Notes (Signed)
   01/24/24 1000  Psych Admission Type (Psych Patients Only)  Admission Status Voluntary  Psychosocial Assessment  Patient Complaints Depression  Eye Contact Fair  Facial Expression Animated  Affect Depressed  Speech Logical/coherent  Interaction Assertive  Motor Activity Other (Comment) (WDL)  Appearance/Hygiene Unremarkable  Behavior Characteristics Appropriate to situation  Mood Depressed  Thought Process  Coherency Circumstantial  Content WDL  Delusions None reported or observed  Perception WDL  Hallucination None reported or observed  Judgment Poor  Confusion None  Danger to Self  Current suicidal ideation?  (denies)  Agreement Not to Harm Self Yes  Description of Agreement verbal  Danger to Others  Danger to Others None reported or observed

## 2024-01-24 NOTE — Progress Notes (Signed)
[facility] MD Progress Note  01/24/2024 1600 Debbie Long  MRN:  [id number]  18 year old Caucasian female presents voluntarily to [facility] with her grandmother, [name], due to worsening depression and suicidal ideation. She reports feeling suicidal since age 56 and states she recently developed a plan to choke herself with a [employer] account planner. One week ago, she attempted suicide by taking five trazodone 100 mg tablets and a bottle of wine but did not seek medical attention. She also reports a history of self-harm, including cutting her throat with a razor blade a few weeks ago.  Subjective:  Chart reviewed, case discussed with multidisciplinary team, patient seen during rounds today. No acute events overnight. Patient visible in the milieu this morning, playing cards, appropriately interacting with peers and staff. She has been isolative in room majority of afternoon and not attending groups. Complains of low mood, feeling tired, but no longer sick. She denies s/s of acute ETOH withdrawal. She complains of constipation. Continues to experience intermittent suicidal ideation without intent or plan, states they are not as prominent. The thoughts have decreased in frequency. She denies perceptual disturbances. She slept well overnight. Reports poor appetite today. She is adherent with medication regimen and denies side effects.   Diagnosis: Principal Problem:   MDD (major depressive disorder), recurrent severe, without psychosis (HCC) Active Problems:   PTSD (post-traumatic stress disorder)   Self-injurious behavior   Self-induced vomiting for weight loss   Borderline personality disorder (HCC)   Cannabis abuse   Alcohol use  Total Time spent with patient: 20 minutes  Past Psychiatric History: History of PTSD, DMDD, MDD and ADHD. Patient hospitalized at [facility] 07/10/23. Two intentional overdoses in the past 9 months. Past trial of Abilify.  Past Medical History:  Past Medical History:  Diagnosis  Date   Anxiety    Asthma    Depression    Eating disorder    Migraine     Past Surgical History:  Procedure Laterality Date   TOE SURGERY     Family History: History reviewed. No pertinent family history. Family Psychiatric  History: Mother- bipolar disorder; brother- autism  Social History:  Social History   Substance and Sexual Activity  Alcohol Use Yes   Comment: socially     Social History   Substance and Sexual Activity  Drug Use Yes   Types: Marijuana    Social History   Socioeconomic History   Marital status: Single    Spouse name: Not on file   Number of children: Not on file   Years of education: Not on file   Highest education level: Not on file  Occupational History   Not on file  Tobacco Use   Smoking status: Some Days    Types: Cigarettes   Smokeless tobacco: Never  Vaping Use   Vaping status: Former  Substance and Sexual Activity   Alcohol use: Yes    Comment: socially   Drug use: Yes    Types: Marijuana   Sexual activity: Yes  Other Topics Concern   Not on file  Social History Narrative   Not on file   Social Drivers of Health   Financial Resource Strain: Low Risk  (08/25/2023)   Received from [facility]   Overall Financial Resource Strain (CARDIA)    Difficulty of Paying Living Expenses: Not hard at all  Food Insecurity: No Food Insecurity (01/21/2024)   Hunger Vital Sign    Worried About Running Out of Food in the Last Year: Never  true    Ran Out of Food in the Last Year: Never true  Transportation Needs: No Transportation Needs (01/21/2024)   PRAPARE - Administrator, Civil Service (Medical): No    Lack of Transportation (Non-Medical): No  Physical Activity: Not on file  Stress: Not on file  Social Connections: Unknown (04/25/2022)   Received from [facility]   Social Network    Social Network: Not on file   Social History: Patient resides with her (m) grandparents. Patient states that she graduated from high  school in December 2024. She is unemployed.  Sleep: Fair  Appetite:  Poor  Current Medications: Current Facility-Administered Medications  Medication Dose Route Frequency Provider Last Rate Last Admin   acetaminophen (TYLENOL) tablet 650 mg  650 mg Oral Q6H PRN White, Patrice L, NP   650 mg at 01/24/24 0953   alum & mag hydroxide-simeth (MAALOX/MYLANTA) 200-200-20 MG/5ML suspension 30 mL  30 mL Oral Q4H PRN White, Patrice L, NP       cariprazine (VRAYLAR) capsule 3 mg  3 mg Oral Daily Dayjah Selman E, NP   3 mg at 01/24/24 0955   haloperidol (HALDOL) tablet 5 mg  5 mg Oral TID PRN White, Patrice L, NP       And   diphenhydrAMINE (BENADRYL) capsule 50 mg  50 mg Oral TID PRN White, Patrice L, NP       diphenhydrAMINE (BENADRYL) injection 50 mg  50 mg Intramuscular TID PRN White, Patrice L, NP       FLUoxetine (PROZAC) capsule 40 mg  40 mg Oral Daily White, Patrice L, NP   40 mg at 01/24/24 0954   folic acid (FOLVITE) tablet 1 mg  1 mg Oral Daily Myriam Forehand, NP   1 mg at 01/24/24 0954   guanFACINE (INTUNIV) ER tablet 4 mg  4 mg Oral QHS White, Patrice L, NP   4 mg at 01/23/24 2109   haloperidol lactate (HALDOL) injection 5 mg  5 mg Intramuscular TID PRN Liborio Nixon L, NP       hydrOXYzine (ATARAX) tablet 25 mg  25 mg Oral TID PRN White, Patrice L, NP   25 mg at 01/23/24 2108   ibuprofen (ADVIL) tablet 400 mg  400 mg Oral Q8H PRN White, Patrice L, NP       LORazepam (ATIVAN) tablet 0-4 mg  0-4 mg Oral Q12H Myriam Forehand, NP   1 mg at 01/24/24 1810   magnesium hydroxide (MILK OF MAGNESIA) suspension 30 mL  30 mL Oral Daily PRN White, Patrice L, NP       multivitamin with minerals tablet 1 tablet  1 tablet Oral Daily Myriam Forehand, NP   1 tablet at 01/24/24 1610   nicotine polacrilex (NICORETTE) gum 2 mg  2 mg Oral PRN Myriam Forehand, NP       ondansetron (ZOFRAN-ODT) disintegrating tablet 4 mg  4 mg Oral Q8H PRN Myriam Forehand, NP   4 mg at 01/21/24 1809   [START ON 01/25/2024]  polyethylene glycol (MIRALAX / GLYCOLAX) packet 17 g  17 g Oral Daily Leetta Hendriks E, NP       thiamine (VITAMIN B1) tablet 100 mg  100 mg Oral Daily Myriam Forehand, NP   100 mg at 01/24/24 9604   Or   thiamine (VITAMIN B1) injection 100 mg  100 mg Intravenous Daily Myriam Forehand, NP       traZODone (DESYREL) tablet 50 mg  50 mg Oral QHS PRN White, Patrice L, NP   50 mg at 01/23/24 2108    Lab Results:  No results found for this or any previous visit (from the past 48 hours).   Blood Alcohol level:  Lab Results  Component Value Date   ETH <10 01/21/2024   ETH <10 01/21/2024    Metabolic Disorder Labs: Lab Results  Component Value Date   HGBA1C 5.2 01/21/2024   MPG 102.54 01/21/2024   MPG 91.06 11/03/2018    Lab Results  Component Value Date   CHOL 151 01/21/2024   TRIG 71 01/21/2024   HDL 49 01/21/2024   CHOLHDL 3.1 01/21/2024   VLDL 14 01/21/2024   LDLCALC 88 01/21/2024   LDLCALC 82 11/03/2018    Physical Findings: AIMS: Facial and Oral Movements Muscles of Facial Expression: None Lips and Perioral Area: None Jaw: None Tongue: None,Extremity Movements Upper (arms, wrists, hands, fingers): None Lower (legs, knees, ankles, toes): None, Trunk Movements Neck, shoulders, hips: None, Global Judgements Severity of abnormal movements overall : None Incapacitation due to abnormal movements: None Patient's awareness of abnormal movements: No Awareness,    CIWA:  CIWA-Ar Total: 4 COWS:     Musculoskeletal: Strength & Muscle Tone: within normal limits Gait & Station: normal Patient leans: N/A  Psychiatric Specialty Exam:  Presentation  General Appearance:  Appropriate for Environment  Eye Contact: Fair  Speech: Clear and Coherent; Normal Rate  Speech Volume: Normal  Handedness: Right   Mood and Affect  Mood: Depressed  Affect: Flat; Congruent   Thought Process  Thought Processes: Coherent  Descriptions of  Associations:Intact  Orientation:Full (Time, Place and Person)  Thought Content:Logical  History of Schizophrenia/Schizoaffective disorder:No  Duration of Psychotic Symptoms:N/A  Hallucinations:None  Ideas of Reference:None  Suicidal Thoughts: Intermittent SI without intent or plan  Homicidal Thoughts:No   Sensorium  Memory: Immediate Good; Recent Good; Remote Good  Judgment: Fair  Insight: Fair   Art therapist  Concentration: Fair  Attention Span: Fair  Recall: Good  Fund of Knowledge: Good  Language: Good   Psychomotor Activity  Psychomotor Activity: Normal   Assets  Assets: Housing; Physical Health; Social Support; Communication Skills   Sleep  Sleep: Fair    Physical Exam: Physical Exam Vitals and nursing note reviewed.  HENT:     Head: Normocephalic and atraumatic.     Nose: Nose normal.     Mouth/Throat:     Mouth: Mucous membranes are moist.  Eyes:     Extraocular Movements: Extraocular movements intact.  Pulmonary:     Effort: Pulmonary effort is normal.  Musculoskeletal:        General: Normal range of motion.     Cervical back: Normal range of motion.  Skin:    General: Skin is warm and dry.  Neurological:     Mental Status: She is alert and oriented to person, place, and time.  Psychiatric:        Attention and Perception: Attention and perception normal.        Mood and Affect: Affect normal. Mood is depressed.        Speech: Speech normal.        Behavior: Behavior is withdrawn. Behavior is cooperative.        Thought Content: Thought content includes suicidal ideation.        Cognition and Memory: Cognition and memory normal.    Review of Systems  Psychiatric/Behavioral:  Positive for depression and substance abuse. Negative for hallucinations and  suicidal ideas. The patient does not have insomnia.   All other systems reviewed and are negative.  Blood pressure (!) 74/44, pulse 68, temperature 98.1 F  (36.7 C), temperature source Oral, resp. rate 16, height 5\' 6"  (1.676 m), weight 71.7 kg, SpO2 99%. Body mass index is 25.5 kg/m.  PLAN:  Principal Problem:   MDD (major depressive disorder), recurrent severe, without psychosis (HCC) Active Problems:   PTSD (post-traumatic stress disorder)   Self-injurious behavior   Self-induced vomiting for weight loss   Borderline personality disorder (HCC)   Cannabis abuse   Alcohol use  1.    Safety and Monitoring:              --  Voluntary admission to inpatient psychiatric unit for safety, stabilization and treatment             -- Daily contact with patient to assess and evaluate symptoms and progress in treatment             -- Patient's case to be discussed in multi-disciplinary team meeting             -- Observation Level : q15 minute checks             -- Vital signs:  q6h x 48 hours, then q12 hours             -- Precautions: suicide  2. Psychiatric Diagnoses and Treatment:   -- Continue Prozac 40mg  PO daily for MDD and PTSD  -- Continue Intuniv 4mg  PO at bedtime for ADHD  -- Continue Vraylar 3mg  PO daily for MDD adjunct  -- Continue hydroxyzine 25mg  PO TID PRN anxiety  -- Continue trazodone 50mg  PO at bedtime PRN insomnia  -- Hold Strattera 25mg  (unavailable)  -- CIWA monitoring and Ativan taper for alcohol withdrawal  -- Restrict patient access to room without staff supervision during meal times and group therapy, monitor for self-induced vomiting --  The risks/benefits/side-effects/alternatives to this medication were discussed in detail with the patient and time was given for questions. The patient consents to medication trial.             -- Metabolic profile and EKG monitoring obtained while on an atypical antipsychotic  (BMI: 25.5;  Lipid Panel: WNL; HbgA1c: 5.2; QTc: 433)             -- Encouraged patient to participate in unit milieu and in scheduled group therapies             -- Short Term Goals: Ability to identify  changes in lifestyle to reduce recurrence of condition will improve, Ability to verbalize feelings will improve, Ability to disclose and discuss suicidal ideas, Ability to demonstrate self-control will improve, Ability to identify and develop effective coping behaviors will improve, Ability to maintain clinical measurements within normal limits will improve, Compliance with prescribed medications will improve, and Ability to identify triggers associated with substance abuse/mental health issues will improve             -- Long Term Goals: Improvement in symptoms so as ready for discharge  3. Medical Issues Being Addressed:  -- Start miralax 17g PO daily PRN for constipation  4. Discharge Planning:             -- Social work and case management to assist with discharge planning and identification of hospital follow-up needs prior to discharge             -- Estimated LOS: 5-7 days             --  Discharge Concerns: Need to establish a safety plan; Medication compliance and effectiveness             -- Discharge Goals: Return home with outpatient referrals for mental health follow-up including medication management/psychotherapy  Neda Willenbring E Tamsyn Owusu, NP 01/24/2024, 9:50 PM

## 2024-01-24 NOTE — Group Note (Signed)
Date:  01/24/2024 Time:  9:28 PM  Group Topic/Focus:  Rediscovering Joy:   The focus of this group is to explore various ways to relieve stress in a positive manner.    Participation Level:  Did Not Attend  Participation Quality:   none  Affect:   none  Cognitive:   none  Insight: None  Engagement in Group:   none  Modes of Intervention:   none  Additional Comments:  none   Rhys Lichty 01/24/2024, 9:28 PM

## 2024-01-24 NOTE — [facility] Suicide Risk Assessment (Signed)
[facility] INPATIENT:  Family/Significant Other Suicide Prevention Education  Suicide Prevention Education:  Contact Attempts: [name], grandmother, [phone] has been identified by the patient as the family member/significant other with whom the patient will be residing, and identified as the person(s) who will aid the patient in the event of a mental health crisis.  With written consent from the patient, two attempts were made to provide suicide prevention education, prior to and/or following the patient's discharge.  We were unsuccessful in providing suicide prevention education.  A suicide education pamphlet was given to the patient to share with family/significant other.  Date and time of first attempt:01/22/2024 at 2:25PM  Date and time of second attempt: 01/24/2024 at 9:47AM  CSW left HIPAA compliant voicemail.    Harden Mo 01/24/2024, 11:43 AM

## 2024-01-24 NOTE — Group Note (Signed)
[facility] LCSW Group Therapy Note   Group Date: 01/24/2024 Start Time: 1300 End Time: 1400   Type of Therapy/Topic:  Group Therapy:  Emotion Regulation  Participation Level:  Did Not Attend   Mood:  Description of Group:    The purpose of this group is to assist patients in learning to regulate negative emotions and experience positive emotions. Patients will be guided to discuss ways in which they have been vulnerable to their negative emotions. These vulnerabilities will be juxtaposed with experiences of positive emotions or situations, and patients challenged to use positive emotions to combat negative ones. Special emphasis will be placed on coping with negative emotions in conflict situations, and patients will process healthy conflict resolution skills.  Therapeutic Goals: Patient will identify two positive emotions or experiences to reflect on in order to balance out negative emotions:  Patient will label two or more emotions that they find the most difficult to experience:  Patient will be able to demonstrate positive conflict resolution skills through discussion or role plays:   Summary of Patient Progress:   X    Therapeutic Modalities:   Cognitive Behavioral Therapy Feelings Identification Dialectical Behavioral Therapy   Harden Mo, LCSW

## 2024-01-24 NOTE — Progress Notes (Signed)
Affect flat, mood depressed rated 8, and anxious rated 7, endorsed SI.  Reported trigger r/t flash back "being abandoned by my father."  Denied plan and intent and entered verbal contract.

## 2024-01-25 NOTE — Plan of Care (Signed)
Patient stated this morning that still having" flash backs " and none of the medicines helping her. Patient visible in the milieu and appropriate with staff &peers. Denies SI,HI and AVH. Appetite and energy level good. Support and encouragement given.

## 2024-01-25 NOTE — Progress Notes (Signed)
   01/24/24 2000  Psych Admission Type (Psych Patients Only)  Admission Status Voluntary  Psychosocial Assessment  Patient Complaints Depression  Eye Contact Fair  Facial Expression Sad  Affect Depressed  Speech Logical/coherent  Interaction Assertive  Motor Activity Slow  Appearance/Hygiene Unremarkable  Behavior Characteristics Appropriate to situation  Mood Depressed  Thought Process  Coherency Circumstantial  Content WDL  Delusions None reported or observed  Perception WDL  Hallucination None reported or observed  Judgment Poor  Confusion None  Danger to Self  Current suicidal ideation? Denies (Denies)  Agreement Not to Harm Self Yes  Description of Agreement Verbal  Danger to Others  Danger to Others None reported or observed

## 2024-01-25 NOTE — Group Note (Signed)
Date:  01/25/2024 Time:  10:10 AM  Group Topic/Focus:  Healthy Communication:   The focus of this group is to discuss communication, barriers to communication, as well as healthy ways to communicate with others.    Participation Level:  Active  Participation Quality:  Appropriate  Affect:  Appropriate  Cognitive:  Appropriate  Insight: Appropriate  Engagement in Group:  Engaged  Modes of Intervention:  Activity  Additional Comments:    Debbie Long 01/25/2024, 10:10 AM

## 2024-01-25 NOTE — Group Note (Signed)
Recreation Therapy Group Note   Group Topic:General Recreation  Group Date: 01/25/2024 Start Time: 1530 End Time: 1615 Facilitators: Rosina Lowenstein, LRT, CTRS Location: Courtyard  Group Description: [employer]. LRT and patients played games of basketball, drew with chalk, and played corn hole while outside in the courtyard while getting fresh air and sunlight. Music was being played in the background. LRT and peers conversed about different games they have played before, what they do in their free time and anything else that is on their minds. LRT encouraged pts to drink water after being outside, sweating and getting their heart rate up.  Goal Area(s) Addressed: Patient will build on frustration tolerance skills. Patients will partake in a competitive play game with peers. Patients will gain knowledge of new leisure interest/hobby.    Affect/Mood: N/A   Participation Level: Did not attend    Clinical Observations/Individualized Feedback: Patient did not attend group.   Plan: Continue to engage patient in RT group sessions 2-3x/week.   Rosina Lowenstein, LRT, CTRS 01/25/2024 5:35 PM

## 2024-01-25 NOTE — Progress Notes (Signed)
Pt in bed on engagement; encouraged OOB and wrapup group attendance.  Endorsed depression and anxiety both rate moderate.  Flashbacks identified as trigger.  Pt states, "I have been having a bad day regarding  flashbacks - the hard times in life: abandonment by my dad; my mother leaving to go and get marry."  When asked what has worked for her in the past? She replied, Christianity  Pt requested to see the chaplain during the day 01/26/24.  She denied SI/HI and contracted for sated.

## 2024-01-25 NOTE — Group Note (Signed)
Recreation Therapy Group Note   Group Topic:Healthy Support Systems  Group Date: 01/25/2024 Start Time: 1000 End Time: 1050 Facilitators: Rosina Lowenstein, LRT, CTRS Location:  Craft Room  Group Description: Straw Bridge. In groups or individually, patients were given 10 plastic drinking straws and an equal length of masking tape. Using the materials provided, patients were instructed to build a free-standing bridge-like structure to suspend an everyday item (ex: deck of cards) off the floor or table surface. All materials were required to be used in Secondary school teacher. LRT facilitated post-activity discussion reviewing the importance of having strong and healthy support systems in our lives. LRT discussed how the people in our lives serve as the tape and the deck of cards we placed on top of our straw structure are the stressors we face in daily life. LRT and pts discussed what happens in our life when things get too heavy for [place], and we don't have strong supports outside of the hospital. Pt shared 2 of their healthy supports in their life aloud in the group.   Goal Area(s) Addressed:  Patient will identify 2 healthy supports in their life. Patient will identify skills to successfully complete activity. Patient will identify correlation of this activity to life post-discharge.  Patient will build on frustration tolerance skills. Patient will increase team building and communication skills.    Affect/Mood: Appropriate and Flat   Participation Level: Moderate   Participation Quality: Independent   Behavior: Calm and Cooperative   Speech/Thought Process: Coherent   Insight: Fair   Judgement: Fair    Modes of Intervention: Exploration, Guided Discussion, Open Conversation, Problem-solving, Reality Testing, and STEM Activity   Patient Response to Interventions:  Receptive   Education Outcome:  Acknowledges education   Clinical Observations/Individualized Feedback: Debbie Long was mostly  active in their participation of session activities and group discussion. Pt identified " my dog and showers" as her healthy supports. Pt minimally interacted with LRT and peers while in group.   Plan: Continue to engage patient in RT group sessions 2-3x/week.   Rosina Lowenstein, LRT, CTRS 01/25/2024 11:28 AM

## 2024-01-25 NOTE — Plan of Care (Signed)
  Problem: Education: Goal: Knowledge of [facility] Education information/materials will improve Outcome: Progressing Goal: Emotional status will improve Outcome: Progressing Goal: Mental status will improve Outcome: Progressing Goal: Verbalization of understanding the information provided will improve Outcome: Progressing   Problem: Activity: Goal: Interest or engagement in activities will improve Outcome: Progressing Goal: Sleeping patterns will improve Outcome: Progressing   Problem: Coping: Goal: Ability to verbalize frustrations and anger appropriately will improve Outcome: Progressing Goal: Ability to demonstrate self-control will improve Outcome: Progressing   Problem: Health Behavior/Discharge Planning: Goal: Identification of resources available to assist in meeting health care needs will improve Outcome: Progressing Goal: Compliance with treatment plan for underlying cause of condition will improve Outcome: Progressing   Problem: Physical Regulation: Goal: Ability to maintain clinical measurements within normal limits will improve Outcome: Progressing   Problem: Safety: Goal: Periods of time without injury will increase Outcome: Progressing   Problem: Coping: Goal: Coping ability will improve Outcome: Progressing Goal: Will verbalize feelings Outcome: Progressing   Problem: Self-Concept: Goal: Ability to identify factors that promote anxiety will improve Outcome: Progressing Goal: Level of anxiety will decrease Outcome: Progressing Goal: Ability to modify response to factors that promote anxiety will improve Outcome: Progressing   Problem: Physical Regulation: Goal: Complications related to the disease process, condition or treatment will be avoided or minimized Outcome: Progressing   Problem: Safety: Goal: Ability to remain free from injury will improve Outcome: Progressing   Problem: Health Behavior/Discharge Planning: Goal:  Identification of resources available to assist in meeting health care needs will improve Outcome: Progressing

## 2024-01-25 NOTE — Group Note (Signed)
[facility] LCSW Group Therapy Note   Group Date: 01/25/2024 Start Time: 1300 End Time: 1400   Type of Therapy/Topic:  Group Therapy:  Balance in Life  Participation Level:  Minimal   Description of Group:    This group will address the concept of balance and how it feels and looks when one is unbalanced. Patients will be encouraged to process areas in their lives that are out of balance, and identify reasons for remaining unbalanced. Facilitators will guide patients utilizing problem- solving interventions to address and correct the stressor making their life unbalanced. Understanding and applying boundaries will be explored and addressed for obtaining  and maintaining a balanced life. Patients will be encouraged to explore ways to assertively make their unbalanced needs known to significant others in their lives, using other group members and facilitator for support and feedback.  Therapeutic Goals: Patient will identify two or more emotions or situations they have that consume much of in their lives. Patient will identify signs/triggers that life has become out of balance:  Patient will identify two ways to set boundaries in order to achieve balance in their lives:  Patient will demonstrate ability to communicate their needs through discussion and/or role plays  Summary of Patient Progress: Patient was present for the entirety of the group process. She shared that utilizing her support system is important with maintaining balance. Pt spoke about being with her best friend was what helped her regain balance in the past. Although she did not speak much during the discussion she appeared to attend to the conversation and her behavior was appropriate. Pt appeared open and receptive to feedback/comments from both her peers and facilitator.   Therapeutic Modalities:   Cognitive Behavioral Therapy Solution-Focused Therapy Assertiveness Training   Glenis Smoker, LCSW

## 2024-01-25 NOTE — Plan of Care (Signed)
?  Problem: Education: ?Goal: Mental status will improve ?Outcome: Progressing ?Goal: Verbalization of understanding the information provided will improve ?Outcome: Progressing ?  ?

## 2024-01-26 MED ORDER — FLUOXETINE HCL 20 MG PO CAPS
60.0000 mg | ORAL_CAPSULE | Freq: Every day | ORAL | Status: DC
  Administered 2024-01-27 – 2024-01-28 (×2): 60 mg via ORAL
  Filled 2024-01-26 (×2): qty 3

## 2024-01-26 NOTE — Plan of Care (Signed)
Patient in & out of her room.more visible in the milieu.Stated that she does not have flash backs today. Denies SI,HI and AVH. Appetite and energy level good. Attended groups. Support and encouragement given.

## 2024-01-26 NOTE — Progress Notes (Signed)
[facility] MD Progress Note  01/25/2024 1756 Debbie Long  MRN:  [id number]  18 year old Caucasian female presents voluntarily to [facility] with her grandmother, [name], due to worsening depression and suicidal ideation. She reports feeling suicidal since age 21 and states she recently developed a plan to choke herself with a [employer] account planner. One week ago, she attempted suicide by taking five trazodone 100 mg tablets and a bottle of wine but did not seek medical attention. She also reports a history of self-harm, including cutting her throat with a razor blade a few weeks ago.  Subjective:  Chart reviewed, case discussed with multidisciplinary team, patient seen during rounds today. Intuniv was held last night due to hypotension. Patient visible in the milieu this morning, playing cards, appropriately interacting with peers and staff. Patient reports she is not doing well today. She complains of experiencing flashbacks intermittently throughout the day. Endorses intermittent SI without intent or plan. Denies HI. Denies perceptual disturbances. No s/s of withdrawal. Her appetite has improved. She is adherent with medication regimen and denies side effects.  Diagnosis: Principal Problem:   MDD (major depressive disorder), recurrent severe, without psychosis (HCC) Active Problems:   PTSD (post-traumatic stress disorder)   Self-injurious behavior   Self-induced vomiting for weight loss   Borderline personality disorder (HCC)   Cannabis abuse   Alcohol use  Total Time spent with patient: 20 minutes  Past Psychiatric History: History of PTSD, DMDD, MDD and ADHD. Patient hospitalized at [facility] 07/10/23. Two intentional overdoses in the past 9 months. Past trial of Abilify.  Past Medical History:  Past Medical History:  Diagnosis Date   Anxiety    Asthma    Depression    Eating disorder    Migraine     Past Surgical History:  Procedure Laterality Date   TOE SURGERY     Family History: History  reviewed. No pertinent family history. Family Psychiatric  History: Mother- bipolar disorder; brother- autism  Social History:  Social History   Substance and Sexual Activity  Alcohol Use Yes   Comment: socially     Social History   Substance and Sexual Activity  Drug Use Yes   Types: Marijuana    Social History   Socioeconomic History   Marital status: Single    Spouse name: Not on file   Number of children: Not on file   Years of education: Not on file   Highest education level: Not on file  Occupational History   Not on file  Tobacco Use   Smoking status: Some Days    Types: Cigarettes   Smokeless tobacco: Never  Vaping Use   Vaping status: Former  Substance and Sexual Activity   Alcohol use: Yes    Comment: socially   Drug use: Yes    Types: Marijuana   Sexual activity: Yes  Other Topics Concern   Not on file  Social History Narrative   Not on file   Social Drivers of Health   Financial Resource Strain: Low Risk  (08/25/2023)   Received from [facility]   Overall Financial Resource Strain (CARDIA)    Difficulty of Paying Living Expenses: Not hard at all  Food Insecurity: No Food Insecurity (01/21/2024)   Hunger Vital Sign    Worried About Running Out of Food in the Last Year: Never true    Ran Out of Food in the Last Year: Never true  Transportation Needs: No Transportation Needs (01/21/2024)   PRAPARE - Transportation  Lack of Transportation (Medical): No    Lack of Transportation (Non-Medical): No  Physical Activity: Not on file  Stress: Not on file  Social Connections: Unknown (04/25/2022)   Received from [facility]   Social Network    Social Network: Not on file   Social History: Patient resides with her (m) grandparents. Patient states that she graduated from high school in December 2024. She is unemployed.  Sleep: Fair  Appetite:  Poor  Current Medications: Current Facility-Administered Medications  Medication Dose Route Frequency  Provider Last Rate Last Admin   acetaminophen (TYLENOL) tablet 650 mg  650 mg Oral Q6H PRN White, Patrice L, NP   650 mg at 01/24/24 0953   alum & mag hydroxide-simeth (MAALOX/MYLANTA) 200-200-20 MG/5ML suspension 30 mL  30 mL Oral Q4H PRN White, Patrice L, NP       cariprazine (VRAYLAR) capsule 3 mg  3 mg Oral Daily Kyren Vaux E, NP   3 mg at 01/26/24 6045   haloperidol (HALDOL) tablet 5 mg  5 mg Oral TID PRN White, Patrice L, NP       And   diphenhydrAMINE (BENADRYL) capsule 50 mg  50 mg Oral TID PRN White, Patrice L, NP       diphenhydrAMINE (BENADRYL) injection 50 mg  50 mg Intramuscular TID PRN White, Patrice L, NP       FLUoxetine (PROZAC) capsule 40 mg  40 mg Oral Daily White, Patrice L, NP   40 mg at 01/26/24 4098   folic acid (FOLVITE) tablet 1 mg  1 mg Oral Daily Myriam Forehand, NP   1 mg at 01/26/24 1191   guanFACINE (INTUNIV) ER tablet 4 mg  4 mg Oral QHS Densel Kronick E, NP   4 mg at 01/25/24 2115   haloperidol lactate (HALDOL) injection 5 mg  5 mg Intramuscular TID PRN Liborio Nixon L, NP       hydrOXYzine (ATARAX) tablet 25 mg  25 mg Oral TID PRN White, Patrice L, NP   25 mg at 01/23/24 2108   ibuprofen (ADVIL) tablet 400 mg  400 mg Oral Q8H PRN White, Patrice L, NP       magnesium hydroxide (MILK OF MAGNESIA) suspension 30 mL  30 mL Oral Daily PRN White, Patrice L, NP       multivitamin with minerals tablet 1 tablet  1 tablet Oral Daily Myriam Forehand, NP   1 tablet at 01/26/24 4782   nicotine polacrilex (NICORETTE) gum 2 mg  2 mg Oral PRN Myriam Forehand, NP       ondansetron (ZOFRAN-ODT) disintegrating tablet 4 mg  4 mg Oral Q8H PRN Myriam Forehand, NP   4 mg at 01/21/24 1809   polyethylene glycol (MIRALAX / GLYCOLAX) packet 17 g  17 g Oral Daily Shawana Knoch E, NP   17 g at 01/26/24 9562   thiamine (VITAMIN B1) tablet 100 mg  100 mg Oral Daily Myriam Forehand, NP   100 mg at 01/26/24 1308   Or   thiamine (VITAMIN B1) injection 100 mg  100 mg Intravenous Daily Myriam Forehand, NP       traZODone (DESYREL) tablet 50 mg  50 mg Oral QHS PRN White, Patrice L, NP   50 mg at 01/25/24 2116    Lab Results:  No results found for this or any previous visit (from the past 48 hours).   Blood Alcohol level:  Lab Results  Component Value Date   ETH <  10 01/21/2024   ETH <10 01/21/2024    Metabolic Disorder Labs: Lab Results  Component Value Date   HGBA1C 5.2 01/21/2024   MPG 102.54 01/21/2024   MPG 91.06 11/03/2018    Lab Results  Component Value Date   CHOL 151 01/21/2024   TRIG 71 01/21/2024   HDL 49 01/21/2024   CHOLHDL 3.1 01/21/2024   VLDL 14 01/21/2024   LDLCALC 88 01/21/2024   LDLCALC 82 11/03/2018    Physical Findings: AIMS: Facial and Oral Movements Muscles of Facial Expression: None Lips and Perioral Area: None Jaw: None Tongue: None,Extremity Movements Upper (arms, wrists, hands, fingers): None Lower (legs, knees, ankles, toes): None, Trunk Movements Neck, shoulders, hips: None, Global Judgements Severity of abnormal movements overall : None Incapacitation due to abnormal movements: None Patient's awareness of abnormal movements: No Awareness,    CIWA:  CIWA-Ar Total: 4 COWS:     Musculoskeletal: Strength & Muscle Tone: within normal limits Gait & Station: normal Patient leans: N/A  Psychiatric Specialty Exam:  Presentation  General Appearance:  Appropriate for Environment  Eye Contact: Fair  Speech: Clear and Coherent; Normal Rate  Speech Volume: Normal  Handedness: Right   Mood and Affect  Mood: Depressed  Affect: Flat; Congruent   Thought Process  Thought Processes: Coherent  Descriptions of Associations:Intact  Orientation:Full (Time, Place and Person)  Thought Content:Logical  History of Schizophrenia/Schizoaffective disorder:No  Duration of Psychotic Symptoms:N/A  Hallucinations:None  Ideas of Reference:None  Suicidal Thoughts: Intermittent SI without intent or plan  Homicidal  Thoughts:No   Sensorium  Memory: Immediate Good; Recent Good; Remote Good  Judgment: Fair  Insight: Fair   Art therapist  Concentration: Fair  Attention Span: Fair  Recall: Good  Fund of Knowledge: Good  Language: Good   Psychomotor Activity  Psychomotor Activity: Normal   Assets  Assets: Housing; Physical Health; Social Support; Communication Skills   Sleep  Sleep: Fair    Physical Exam: Physical Exam Vitals and nursing note reviewed.  HENT:     Head: Normocephalic and atraumatic.     Nose: Nose normal.     Mouth/Throat:     Mouth: Mucous membranes are moist.  Eyes:     Extraocular Movements: Extraocular movements intact.  Pulmonary:     Effort: Pulmonary effort is normal.  Musculoskeletal:        General: Normal range of motion.     Cervical back: Normal range of motion.  Skin:    General: Skin is warm and dry.  Neurological:     Mental Status: She is alert and oriented to person, place, and time.  Psychiatric:        Attention and Perception: Attention and perception normal.        Mood and Affect: Mood is anxious and depressed. Affect is flat.        Speech: Speech normal.        Behavior: Behavior is withdrawn. Behavior is cooperative.        Thought Content: Thought content includes suicidal ideation.        Cognition and Memory: Cognition and memory normal.    Review of Systems  Psychiatric/Behavioral:  Positive for depression and substance abuse. Negative for hallucinations and suicidal ideas. The patient does not have insomnia.   All other systems reviewed and are negative.  Blood pressure (!) 91/56, pulse 69, temperature 97.9 F (36.6 C), resp. rate 16, height 5\' 6"  (1.676 m), weight 71.7 kg, SpO2 98%. Body mass index is 25.5 kg/m.  PLAN:  Principal Problem:   MDD (major depressive disorder), recurrent severe, without psychosis (HCC) Active Problems:   PTSD (post-traumatic stress disorder)   Self-injurious  behavior   Self-induced vomiting for weight loss   Borderline personality disorder (HCC)   Cannabis abuse   Alcohol use  1.    Safety and Monitoring:              --  Voluntary admission to inpatient psychiatric unit for safety, stabilization and treatment             -- Daily contact with patient to assess and evaluate symptoms and progress in treatment             -- Patient's case to be discussed in multi-disciplinary team meeting             -- Observation Level : q15 minute checks             -- Vital signs:  q6h x 48 hours, then q12 hours             -- Precautions: suicide  2. Psychiatric Diagnoses and Treatment:  -- Continue Prozac 40mg  PO daily for MDD and PTSD  -- Continue Intuniv 4mg  PO at bedtime for ADHD  -- Continue Vraylar 3mg  PO daily for MDD adjunct  -- Continue hydroxyzine 25mg  PO TID PRN anxiety  -- Continue trazodone 50mg  PO at bedtime PRN insomnia  -- Hold Strattera 25mg  (unavailable)  -- CIWA monitoring and Ativan taper for alcohol withdrawal  -- Restrict patient access to room without staff supervision during meal times and group therapy, monitor for self-induced vomiting --  The risks/benefits/side-effects/alternatives to this medication were discussed in detail with the patient and time was given for questions. The patient consents to medication trial.             -- Metabolic profile and EKG monitoring obtained while on an atypical antipsychotic  (BMI: 25.5;  Lipid Panel: WNL; HbgA1c: 5.2; QTc: 433)             -- Encouraged patient to participate in unit milieu and in scheduled group therapies             -- Short Term Goals: Ability to identify changes in lifestyle to reduce recurrence of condition will improve, Ability to verbalize feelings will improve, Ability to disclose and discuss suicidal ideas, Ability to demonstrate self-control will improve, Ability to identify and develop effective coping behaviors will improve, Ability to maintain clinical  measurements within normal limits will improve, Compliance with prescribed medications will improve, and Ability to identify triggers associated with substance abuse/mental health issues will improve             -- Long Term Goals: Improvement in symptoms so as ready for discharge  3. Medical Issues Being Addressed:  -- Continue miralax 17g PO daily PRN for constipation  4. Discharge Planning:             -- Social work and case management to assist with discharge planning and identification of hospital follow-up needs prior to discharge             -- Estimated LOS: 5-7 days             -- Discharge Concerns: Need to establish a safety plan; Medication compliance and effectiveness             -- Discharge Goals: Return home with outpatient referrals for mental health follow-up including medication management/psychotherapy  Latrelle Bazar Robyne Peers, NP

## 2024-01-26 NOTE — Group Note (Signed)
Recreation Therapy Group Note   Group Topic:Leisure Education  Group Date: 01/26/2024 Start Time: 1000 End Time: 1100 Facilitators: Rosina Lowenstein, LRT, CTRS Location:  Craft Room  Group Description: Leisure. Patients were given the option to choose from singing karaoke, coloring mandalas, using oil pastels, journaling, or playing with play-doh. LRT and pts discussed the meaning of leisure, the importance of participating in leisure during their free time/when they're outside of the hospital, as well as how our leisure interests can also serve as coping skills.    Goal Area(s) Addressed:   Patient will identify a current leisure interest.   Patient will learn the definition of "leisure".  Patient will practice making a positive decision.  Patient will have the opportunity to try a new leisure activity.  Patient will communicate with peers and LRT.     Affect/Mood: Appropriate   Participation Level: Active and Engaged   Participation Quality: Independent   Behavior: Calm and Cooperative   Speech/Thought Process: Coherent   Insight: Good   Judgement: Good   Modes of Intervention: Education, Exploration, Music, Open Conversation, Rapport Building, and Socialization   Patient Response to Interventions:  Attentive, Engaged, Interested , and Receptive   Education Outcome:  Acknowledges education   Clinical Observations/Individualized Feedback: Debbie Long was active in their participation of session activities and group discussion. Pt identified "listen to music and draw" as things she does in her free time. Pt chose to sing karaoke and color while in group. Pt interacted well with LRT and peers duration of session.    Plan: Continue to engage patient in RT group sessions 2-3x/week.   Rosina Lowenstein, LRT, CTRS 01/26/2024 11:19 AM

## 2024-01-26 NOTE — Progress Notes (Addendum)
[facility] MD Progress Note  Debbie Long  MRN:  [id number]  18 year old Caucasian female presents voluntarily to [facility] with her grandmother, [name], due to worsening depression and suicidal ideation. She reports feeling suicidal since age 23 and states she recently developed a plan to choke herself with a [employer] account planner. One week ago, she attempted suicide by taking five trazodone 100 mg tablets and a bottle of wine but did not seek medical attention. She also reports a history of self-harm, including cutting her throat with a razor blade a few weeks ago.  Subjective:  Chart reviewed, case discussed with multidisciplinary team, patient seen during rounds today. No acute events overnight. Patient reports she is doing better today. She denies thoughts of harming herself and others. She denies perceptual disturbances. She denies flashbacks. Still experiencing low mood and low energy. She is adherent with medications and denies side effects. Will increase fluoxetine to target depression symptoms.  Discharge planning in process. Patient will follow-up at [facility] for outpatient services.  Diagnosis: Principal Problem:   MDD (major depressive disorder), recurrent severe, without psychosis (HCC) Active Problems:   PTSD (post-traumatic stress disorder)   Self-injurious behavior   Self-induced vomiting for weight loss   Borderline personality disorder (HCC)   Cannabis abuse   Alcohol use  Total Time spent with patient: 20 minutes  Past Psychiatric History: History of PTSD, DMDD, MDD and ADHD. Patient hospitalized at [facility] 07/10/23. Two intentional overdoses in the past 9 months. Past trial of Abilify.  Past Medical History:  Past Medical History:  Diagnosis Date   Anxiety    Asthma    Depression    Eating disorder    Migraine     Past Surgical History:  Procedure Laterality Date   TOE SURGERY     Family History: History reviewed. No pertinent family history. Family Psychiatric   History: Mother- bipolar disorder; brother- autism  Social History:  Social History   Substance and Sexual Activity  Alcohol Use Yes   Comment: socially     Social History   Substance and Sexual Activity  Drug Use Yes   Types: Marijuana    Social History   Socioeconomic History   Marital status: Single    Spouse name: Not on file   Number of children: Not on file   Years of education: Not on file   Highest education level: Not on file  Occupational History   Not on file  Tobacco Use   Smoking status: Some Days    Types: Cigarettes   Smokeless tobacco: Never  Vaping Use   Vaping status: Former  Substance and Sexual Activity   Alcohol use: Yes    Comment: socially   Drug use: Yes    Types: Marijuana   Sexual activity: Yes  Other Topics Concern   Not on file  Social History Narrative   Not on file   Social Drivers of Health   Financial Resource Strain: Low Risk  (08/25/2023)   Received from [facility]   Overall Financial Resource Strain (CARDIA)    Difficulty of Paying Living Expenses: Not hard at all  Food Insecurity: No Food Insecurity (01/21/2024)   Hunger Vital Sign    Worried About Running Out of Food in the Last Year: Never true    Ran Out of Food in the Last Year: Never true  Transportation Needs: No Transportation Needs (01/21/2024)   PRAPARE - Transportation    Lack of Transportation (Medical): No    Lack  of Transportation (Non-Medical): No  Physical Activity: Not on file  Stress: Not on file  Social Connections: Unknown (04/25/2022)   Received from [facility]   Social Network    Social Network: Not on file   Social History: Patient resides with her (m) grandparents. Patient states that she graduated from high school in December 2024. She is unemployed.  Sleep: Fair  Appetite:  Poor  Current Medications: Current Facility-Administered Medications  Medication Dose Route Frequency Provider Last Rate Last Admin   acetaminophen (TYLENOL) tablet  650 mg  650 mg Oral Q6H PRN White, Patrice L, NP   650 mg at 01/24/24 0953   alum & mag hydroxide-simeth (MAALOX/MYLANTA) 200-200-20 MG/5ML suspension 30 mL  30 mL Oral Q4H PRN White, Patrice L, NP       cariprazine (VRAYLAR) capsule 3 mg  3 mg Oral Daily Kathryn Linarez E, NP   3 mg at 01/26/24 4098   haloperidol (HALDOL) tablet 5 mg  5 mg Oral TID PRN White, Patrice L, NP       And   diphenhydrAMINE (BENADRYL) capsule 50 mg  50 mg Oral TID PRN White, Patrice L, NP       diphenhydrAMINE (BENADRYL) injection 50 mg  50 mg Intramuscular TID PRN White, Patrice L, NP       FLUoxetine (PROZAC) capsule 40 mg  40 mg Oral Daily White, Patrice L, NP   40 mg at 01/26/24 1191   folic acid (FOLVITE) tablet 1 mg  1 mg Oral Daily Myriam Forehand, NP   1 mg at 01/26/24 4782   guanFACINE (INTUNIV) ER tablet 4 mg  4 mg Oral QHS Harvest Stanco E, NP   4 mg at 01/25/24 2115   haloperidol lactate (HALDOL) injection 5 mg  5 mg Intramuscular TID PRN Liborio Nixon L, NP       hydrOXYzine (ATARAX) tablet 25 mg  25 mg Oral TID PRN White, Patrice L, NP   25 mg at 01/23/24 2108   ibuprofen (ADVIL) tablet 400 mg  400 mg Oral Q8H PRN White, Patrice L, NP       magnesium hydroxide (MILK OF MAGNESIA) suspension 30 mL  30 mL Oral Daily PRN White, Patrice L, NP       multivitamin with minerals tablet 1 tablet  1 tablet Oral Daily Myriam Forehand, NP   1 tablet at 01/26/24 9562   nicotine polacrilex (NICORETTE) gum 2 mg  2 mg Oral PRN Myriam Forehand, NP       ondansetron (ZOFRAN-ODT) disintegrating tablet 4 mg  4 mg Oral Q8H PRN Myriam Forehand, NP   4 mg at 01/21/24 1809   polyethylene glycol (MIRALAX / GLYCOLAX) packet 17 g  17 g Oral Daily Jozeph Persing E, NP   17 g at 01/26/24 1308   thiamine (VITAMIN B1) tablet 100 mg  100 mg Oral Daily Myriam Forehand, NP   100 mg at 01/26/24 6578   Or   thiamine (VITAMIN B1) injection 100 mg  100 mg Intravenous Daily Myriam Forehand, NP       traZODone (DESYREL) tablet 50 mg  50 mg Oral QHS  PRN White, Patrice L, NP   50 mg at 01/25/24 2116    Lab Results:  No results found for this or any previous visit (from the past 48 hours).   Blood Alcohol level:  Lab Results  Component Value Date   ETH <10 01/21/2024   ETH <10 01/21/2024  Metabolic Disorder Labs: Lab Results  Component Value Date   HGBA1C 5.2 01/21/2024   MPG 102.54 01/21/2024   MPG 91.06 11/03/2018    Lab Results  Component Value Date   CHOL 151 01/21/2024   TRIG 71 01/21/2024   HDL 49 01/21/2024   CHOLHDL 3.1 01/21/2024   VLDL 14 01/21/2024   LDLCALC 88 01/21/2024   LDLCALC 82 11/03/2018    Physical Findings: AIMS: Facial and Oral Movements Muscles of Facial Expression: None Lips and Perioral Area: None Jaw: None Tongue: None,Extremity Movements Upper (arms, wrists, hands, fingers): None Lower (legs, knees, ankles, toes): None, Trunk Movements Neck, shoulders, hips: None, Global Judgements Severity of abnormal movements overall : None Incapacitation due to abnormal movements: None Patient's awareness of abnormal movements: No Awareness,    CIWA:  CIWA-Ar Total: 4 COWS:     Musculoskeletal: Strength & Muscle Tone: within normal limits Gait & Station: normal Patient leans: N/A  Psychiatric Specialty Exam:  Presentation  General Appearance:  Appropriate for Environment  Eye Contact: Fair  Speech: Clear and Coherent; Normal Rate  Speech Volume: Normal  Handedness: Right   Mood and Affect  Mood: Depressed  Affect: Flat; Congruent   Thought Process  Thought Processes: Coherent  Descriptions of Associations:Intact  Orientation:Full (Time, Place and Person)  Thought Content:Logical  History of Schizophrenia/Schizoaffective disorder:No  Duration of Psychotic Symptoms:N/A  Hallucinations:None  Ideas of Reference:None  Suicidal Thoughts: Intermittent SI without intent or plan  Homicidal Thoughts:No   Sensorium  Memory: Immediate Good; Recent Good;  Remote Good  Judgment: Fair  Insight: Fair   Art therapist  Concentration: Fair  Attention Span: Fair  Recall: Good  Fund of Knowledge: Good  Language: Good   Psychomotor Activity  Psychomotor Activity: Normal   Assets  Assets: Housing; Physical Health; Social Support; Communication Skills   Sleep  Sleep: Fair    Physical Exam: Physical Exam Vitals and nursing note reviewed.  HENT:     Head: Normocephalic and atraumatic.     Nose: Nose normal.     Mouth/Throat:     Mouth: Mucous membranes are moist.  Eyes:     Extraocular Movements: Extraocular movements intact.  Pulmonary:     Effort: Pulmonary effort is normal.  Musculoskeletal:        General: Normal range of motion.     Cervical back: Normal range of motion.  Skin:    General: Skin is warm and dry.  Neurological:     Mental Status: She is alert and oriented to person, place, and time.  Psychiatric:        Attention and Perception: Attention and perception normal.        Mood and Affect: Mood is anxious and depressed. Affect is flat.        Speech: Speech normal.        Behavior: Behavior is withdrawn. Behavior is cooperative.        Thought Content: Thought content includes suicidal ideation.        Cognition and Memory: Cognition and memory normal.    Review of Systems  Psychiatric/Behavioral:  Positive for depression and substance abuse. Negative for hallucinations and suicidal ideas. The patient does not have insomnia.   All other systems reviewed and are negative.  Blood pressure (!) 91/56, pulse 69, temperature 97.9 F (36.6 C), resp. rate 16, height 5\' 6"  (1.676 m), weight 71.7 kg, SpO2 98%. Body mass index is 25.5 kg/m.  PLAN:  Principal Problem:   MDD (major depressive  disorder), recurrent severe, without psychosis (HCC) Active Problems:   PTSD (post-traumatic stress disorder)   Self-injurious behavior   Self-induced vomiting for weight loss   Borderline  personality disorder (HCC)   Cannabis abuse   Alcohol use  1.    Safety and Monitoring:              --  Voluntary admission to inpatient psychiatric unit for safety, stabilization and treatment             -- Daily contact with patient to assess and evaluate symptoms and progress in treatment             -- Patient's case to be discussed in multi-disciplinary team meeting             -- Observation Level : q15 minute checks             -- Vital signs:  q6h x 48 hours, then q12 hours             -- Precautions: suicide  2. Psychiatric Diagnoses and Treatment:  -- Increase Prozac 60mg  PO daily for MDD and PTSD  -- Continue Intuniv 4mg  PO at bedtime for ADHD  -- Continue Vraylar 3mg  PO daily for MDD adjunct  -- Continue hydroxyzine 25mg  PO TID PRN anxiety  -- Continue trazodone 50mg  PO at bedtime PRN insomnia  -- Hold Strattera 25mg  (unavailable)  -- CIWA monitoring and Ativan taper for alcohol withdrawal  -- Restrict patient access to room without staff supervision during meal times and group therapy, monitor for self-induced vomiting --  The risks/benefits/side-effects/alternatives to this medication were discussed in detail with the patient and time was given for questions. The patient consents to medication trial.             -- Metabolic profile and EKG monitoring obtained while on an atypical antipsychotic  (BMI: 25.5;  Lipid Panel: WNL; HbgA1c: 5.2; QTc: 433)             -- Encouraged patient to participate in unit milieu and in scheduled group therapies             -- Short Term Goals: Ability to identify changes in lifestyle to reduce recurrence of condition will improve, Ability to verbalize feelings will improve, Ability to disclose and discuss suicidal ideas, Ability to demonstrate self-control will improve, Ability to identify and develop effective coping behaviors will improve, Ability to maintain clinical measurements within normal limits will improve, Compliance with  prescribed medications will improve, and Ability to identify triggers associated with substance abuse/mental health issues will improve             -- Long Term Goals: Improvement in symptoms so as ready for discharge  3. Medical Issues Being Addressed:  -- Continue miralax 17g PO daily PRN for constipation  4. Discharge Planning:             -- Social work and case management to assist with discharge planning and identification of hospital follow-up needs prior to discharge             -- Estimated LOS: 5-7 days             -- Discharge Concerns: Need to establish a safety plan; Medication compliance and effectiveness             -- Discharge Goals: Return home with outpatient referrals for mental health follow-up including medication management/psychotherapy. Strongly recommend DBT  Damont Balles Robyne Peers, NP

## 2024-01-27 MED ORDER — GUANFACINE HCL ER 4 MG PO TB24: 4.0000 mg | ORAL_TABLET | Freq: Every day | ORAL | 0 refills | Status: DC

## 2024-01-27 MED ORDER — NICOTINE POLACRILEX 2 MG MT GUM: 2.0000 mg | CHEWING_GUM | OROMUCOSAL | 0 refills | Status: DC | PRN

## 2024-01-27 MED ORDER — FLUOXETINE HCL 20 MG PO CAPS: 60.0000 mg | ORAL_CAPSULE | Freq: Every day | ORAL | 0 refills | Status: DC

## 2024-01-27 MED ORDER — CARIPRAZINE HCL 3 MG PO CAPS: 3.0000 mg | ORAL_CAPSULE | Freq: Every day | ORAL | 0 refills | Status: DC

## 2024-01-27 NOTE — [facility] Suicide Risk Assessment (Signed)
Suicide Risk Assessment  Discharge Assessment    [facility] Discharge Suicide Risk Assessment   Principal Problem: MDD (major depressive disorder), recurrent severe, without psychosis (HCC) Discharge Diagnoses: Principal Problem:   MDD (major depressive disorder), recurrent severe, without psychosis (HCC) Active Problems:   PTSD (post-traumatic stress disorder)   Self-induced vomiting for weight loss   Borderline personality disorder (HCC)   Cannabis abuse   Alcohol use   Total Time spent with patient: 20 minutes  Musculoskeletal: Strength & Muscle Tone: within normal limits Gait & Station: normal Patient leans: N/A  Psychiatric Specialty Exam  Presentation  General Appearance:  Appropriate for Environment; Well Groomed  Eye Contact: Good  Speech: Normal Rate; Clear and Coherent  Speech Volume: Normal  Handedness: Right   Mood and Affect  Mood: Euthymic  Duration of Depression Symptoms: Greater than two weeks  Affect: Appropriate   Thought Process  Thought Processes: Coherent  Descriptions of Associations:Intact  Orientation:Full (Time, Place and Person)  Thought Content:Logical  History of Schizophrenia/Schizoaffective disorder:No  Duration of Psychotic Symptoms:N/A  Hallucinations:Hallucinations: None  Ideas of Reference:None  Suicidal Thoughts:Suicidal Thoughts: No  Homicidal Thoughts:Homicidal Thoughts: No   Sensorium  Memory: Immediate Good; Recent Good; Remote Good  Judgment: Fair  Insight: Fair   Art therapist  Concentration: Good  Attention Span: Good  Recall: Good  Fund of Knowledge: Good  Language: Good   Psychomotor Activity  Psychomotor Activity: Psychomotor Activity: Normal   Assets  Assets: Communication Skills; Housing; Other (comment) (Family support)   Sleep  Sleep: Sleep: Good   Physical Exam: Physical Exam ROS Blood pressure (!) 92/53, pulse 69, temperature (!) 97.4 F (36.3  C), resp. rate 16, height 5\' 6"  (1.676 m), weight 71.7 kg, SpO2 99%. Body mass index is 25.5 kg/m.  Mental Status Per Nursing Assessment::   On Admission:  NA  Demographic Factors:  Adolescent or young adult  Loss Factors: NA  Historical Factors: {Historical Factors:20660}  Risk Reduction Factors:   Living with another person, especially a relative and Positive social support  Continued Clinical Symptoms:  Alcohol/Substance Abuse/Dependencies Personality Disorders:   Cluster B Comorbid alcohol abuse/dependence More than one psychiatric diagnosis  Cognitive Features That Contribute To Risk:  None    Suicide Risk:  Minimal: No identifiable suicidal ideation.  Patients presenting with no risk factors but with morbid ruminations; may be classified as minimal risk based on the severity of the depressive symptoms   Follow-up Information     [facility]. Go to.   Why: Vir[facility] appointment is 02/02/24 at 10 AM. You will receive a call at [phone]. Contact information: [address]  [address] [place] [address] [phone]         [facility] Go to.   Why: Medication management appointment with Merian Capron 02/16/24 at 2:40 PM. Contact information: [address] [place] [address] [phone]                 Plan Of Care/Follow-up recommendations:  {[facility] DC FU RECOMMENDATIONS:22620}  Paulene Floor, PA-C 01/27/2024, 7:34 PM

## 2024-01-27 NOTE — Plan of Care (Signed)
  Problem: Education: Goal: Knowledge of [facility] Education information/materials will improve Outcome: Adequate for Discharge Goal: Emotional status will improve Outcome: Adequate for Discharge Goal: Mental status will improve Outcome: Adequate for Discharge Goal: Verbalization of understanding the information provided will improve Outcome: Adequate for Discharge   Problem: Activity: Goal: Interest or engagement in activities will improve Outcome: Adequate for Discharge Goal: Sleeping patterns will improve Outcome: Adequate for Discharge   Problem: Coping: Goal: Ability to verbalize frustrations and anger appropriately will improve Outcome: Adequate for Discharge Goal: Ability to demonstrate self-control will improve Outcome: Adequate for Discharge   

## 2024-01-27 NOTE — Progress Notes (Signed)
Patient is expected to discharge tomorrow Feb 16 to her home.

## 2024-01-27 NOTE — Progress Notes (Signed)
   01/26/24 2000  Psych Admission Type (Psych Patients Only)  Admission Status Voluntary  Psychosocial Assessment  Patient Complaints Depression;Anxiety  Eye Contact Fair  Facial Expression Animated  Affect Appropriate to circumstance  Speech Logical/coherent  Interaction Assertive  Motor Activity Slow  Appearance/Hygiene Unremarkable  Behavior Characteristics Cooperative;Appropriate to situation  Mood Pleasant  Thought Process  Coherency WDL  Content WDL  Delusions None reported or observed  Perception WDL  Hallucination None reported or observed  Judgment Impaired  Confusion None  Danger to Self  Current suicidal ideation?  (Denies)  Agreement Not to Harm Self Yes  Description of Agreement verbal

## 2024-01-27 NOTE — Plan of Care (Signed)
   Problem: Education: Goal: Knowledge of [place] [facility] Education information/materials will improve Outcome: Progressing Goal: Emotional status will improve Outcome: Progressing Goal: Mental status will improve Outcome: Progressing

## 2024-01-27 NOTE — Progress Notes (Signed)
   01/27/24 1000  Psych Admission Type (Psych Patients Only)  Admission Status Voluntary  Psychosocial Assessment  Patient Complaints None  Eye Contact Fair  Facial Expression Flat  Affect Depressed  Speech Logical/coherent  Interaction Assertive  Motor Activity Slow  Appearance/Hygiene Unremarkable  Behavior Characteristics Cooperative  Mood Pleasant  Thought Process  Coherency WDL  Content WDL  Delusions None reported or observed  Perception WDL  Hallucination None reported or observed  Judgment Impaired  Confusion None  Danger to Self  Current suicidal ideation? Denies  Agreement Not to Harm Self Yes  Description of Agreement verbal  Danger to Others  Danger to Others None reported or observed

## 2024-01-27 NOTE — BH IP Treatment Plan (Signed)
Interdisciplinary Treatment and Diagnostic Plan Update  01/27/2024 Time of Session: 11:02 am Debbie Long MRN: [id number]  Principal Diagnosis: MDD (major depressive disorder), recurrent severe, without psychosis (HCC)  Secondary Diagnoses: Principal Problem:   MDD (major depressive disorder), recurrent severe, without psychosis (HCC) Active Problems:   PTSD (post-traumatic stress disorder)   Self-injurious behavior   Self-induced vomiting for weight loss   Borderline personality disorder (HCC)   Cannabis abuse   Alcohol use   Current Medications:  Current Facility-Administered Medications  Medication Dose Route Frequency Provider Last Rate Last Admin   acetaminophen (TYLENOL) tablet 650 mg  650 mg Oral Q6H PRN White, Patrice L, NP   650 mg at 01/24/24 0953   alum & mag hydroxide-simeth (MAALOX/MYLANTA) 200-200-20 MG/5ML suspension 30 mL  30 mL Oral Q4H PRN White, Patrice L, NP       cariprazine (VRAYLAR) capsule 3 mg  3 mg Oral Daily Remington, Amber E, NP   3 mg at 01/27/24 0846   haloperidol (HALDOL) tablet 5 mg  5 mg Oral TID PRN White, Patrice L, NP       And   diphenhydrAMINE (BENADRYL) capsule 50 mg  50 mg Oral TID PRN White, Patrice L, NP       diphenhydrAMINE (BENADRYL) injection 50 mg  50 mg Intramuscular TID PRN White, Patrice L, NP       FLUoxetine (PROZAC) capsule 60 mg  60 mg Oral Daily Remington, Amber E, NP   60 mg at 01/27/24 0846   folic acid (FOLVITE) tablet 1 mg  1 mg Oral Daily Myriam Forehand, NP   1 mg at 01/27/24 0846   guanFACINE (INTUNIV) ER tablet 4 mg  4 mg Oral QHS Remington, Amber E, NP   4 mg at 01/26/24 2146   haloperidol lactate (HALDOL) injection 5 mg  5 mg Intramuscular TID PRN Liborio Nixon L, NP       hydrOXYzine (ATARAX) tablet 25 mg  25 mg Oral TID PRN White, Patrice L, NP   25 mg at 01/23/24 2108   ibuprofen (ADVIL) tablet 400 mg  400 mg Oral Q8H PRN White, Patrice L, NP   400 mg at 01/27/24 0546   magnesium hydroxide (MILK OF MAGNESIA)  suspension 30 mL  30 mL Oral Daily PRN White, Patrice L, NP       multivitamin with minerals tablet 1 tablet  1 tablet Oral Daily Myriam Forehand, NP   1 tablet at 01/27/24 0846   nicotine polacrilex (NICORETTE) gum 2 mg  2 mg Oral PRN Myriam Forehand, NP       ondansetron (ZOFRAN-ODT) disintegrating tablet 4 mg  4 mg Oral Q8H PRN Myriam Forehand, NP   4 mg at 01/21/24 1809   polyethylene glycol (MIRALAX / GLYCOLAX) packet 17 g  17 g Oral Daily Remington, Amber E, NP   17 g at 01/27/24 0846   thiamine (VITAMIN B1) tablet 100 mg  100 mg Oral Daily Myriam Forehand, NP   100 mg at 01/27/24 4098   Or   thiamine (VITAMIN B1) injection 100 mg  100 mg Intravenous Daily Myriam Forehand, NP       traZODone (DESYREL) tablet 50 mg  50 mg Oral QHS PRN White, Patrice L, NP   50 mg at 01/26/24 2147   PTA Medications: Medications Prior to Admission  Medication Sig Dispense Refill Last Dose/Taking   albuterol (PROVENTIL HFA;VENTOLIN HFA) 108 (90 Base) MCG/ACT inhaler Inhale 2 puffs into  the lungs every 4 (four) hours as needed for wheezing or shortness of breath.      cariprazine (VRAYLAR) 1.5 MG capsule Take by mouth.      FLUoxetine (PROZAC) 20 MG capsule Take 1 capsule (20 mg total) by mouth daily. 30 capsule 0     Patient Stressors: Health problems   Marital or family conflict   Substance abuse    Patient Strengths: Printmaker for treatment/growth  Supportive family/friends   Treatment Modalities: Medication Management, Group therapy, Case management,  1 to 1 session with clinician, Psychoeducation, Recreational therapy.   Physician Treatment Plan for Primary Diagnosis: MDD (major depressive disorder), recurrent severe, without psychosis (HCC) Long Term Goal(s): Improvement in symptoms so as ready for discharge   Short Term Goals: Ability to identify changes in lifestyle to reduce recurrence of condition will improve Ability to verbalize feelings will improve Ability to disclose  and discuss suicidal ideas Ability to demonstrate self-control will improve Ability to identify and develop effective coping behaviors will improve Ability to maintain clinical measurements within normal limits will improve Compliance with prescribed medications will improve Ability to identify triggers associated with substance abuse/mental health issues will improve  Medication Management: Evaluate patient's response, side effects, and tolerance of medication regimen.  Therapeutic Interventions: 1 to 1 sessions, Unit Group sessions and Medication administration.  Evaluation of Outcomes: Progressing  Physician Treatment Plan for Secondary Diagnosis: Principal Problem:   MDD (major depressive disorder), recurrent severe, without psychosis (HCC) Active Problems:   PTSD (post-traumatic stress disorder)   Self-injurious behavior   Self-induced vomiting for weight loss   Borderline personality disorder (HCC)   Cannabis abuse   Alcohol use  Long Term Goal(s): Improvement in symptoms so as ready for discharge   Short Term Goals: Ability to identify changes in lifestyle to reduce recurrence of condition will improve Ability to verbalize feelings will improve Ability to disclose and discuss suicidal ideas Ability to demonstrate self-control will improve Ability to identify and develop effective coping behaviors will improve Ability to maintain clinical measurements within normal limits will improve Compliance with prescribed medications will improve Ability to identify triggers associated with substance abuse/mental health issues will improve     Medication Management: Evaluate patient's response, side effects, and tolerance of medication regimen.  Therapeutic Interventions: 1 to 1 sessions, Unit Group sessions and Medication administration.  Evaluation of Outcomes: Progressing   RN Treatment Plan for Primary Diagnosis: MDD (major depressive disorder), recurrent severe, without  psychosis (HCC) Long Term Goal(s): Knowledge of disease and therapeutic regimen to maintain health will improve  Short Term Goals: Ability to remain free from injury will improve, Ability to verbalize frustration and anger appropriately will improve, Ability to demonstrate self-control, Ability to participate in decision making will improve, Ability to verbalize feelings will improve, Ability to disclose and discuss suicidal ideas, Ability to identify and develop effective coping behaviors will improve, and Compliance with prescribed medications will improve  Medication Management: RN will administer medications as ordered by provider, will assess and evaluate patient's response and provide education to patient for prescribed medication. RN will report any adverse and/or side effects to prescribing provider.  Therapeutic Interventions: 1 on 1 counseling sessions, Psychoeducation, Medication administration, Evaluate responses to treatment, Monitor vital signs and CBGs as ordered, Perform/monitor CIWA, COWS, AIMS and Fall Risk screenings as ordered, Perform wound care treatments as ordered.  Evaluation of Outcomes: Progressing   LCSW Treatment Plan for Primary Diagnosis: MDD (major depressive disorder), recurrent severe, without psychosis (  HCC) Long Term Goal(s): Safe transition to appropriate next level of care at discharge, Engage patient in therapeutic group addressing interpersonal concerns.  Short Term Goals: Engage patient in aftercare planning with referrals and resources, Increase social support, Increase ability to appropriately verbalize feelings, Increase emotional regulation, Facilitate acceptance of mental health diagnosis and concerns, and Increase skills for wellness and recovery  Therapeutic Interventions: Assess for all discharge needs, 1 to 1 time with Social worker, Explore available resources and support systems, Assess for adequacy in community support network, Educate family and  significant other(s) on suicide prevention, Complete Psychosocial Assessment, Interpersonal group therapy.  Evaluation of Outcomes: Progressing   Progress in Treatment: Attending groups: No. Participating in groups: No. Taking medication as prescribed: Yes. Toleration medication: Yes. Family/Significant other contact made: No, will contact:  Once someone answers the call. Patient understands diagnosis: Yes. Discussing patient identified problems/goals with staff: Yes. Medical problems stabilized or resolved: Yes. Denies suicidal/homicidal ideation: Yes. Issues/concerns per patient self-inventory: No. Other: none  New problem(s) identified: No, Describe:  none identified. Update 01/27/2024: No changes at this time.    New Short Term/Long Term Goal(s): detox, medication management for mood stabilization; elimination of SI thoughts; development of comprehensive mental wellness/sobriety plan. Update 01/27/2024: No changes at this time.    Patient Goals:  "Learn how to control myself." Pt also expressed desire to stay safe, learn to better regulate her emotions, and help with substance use.  Update 01/27/2024: No changes at this time.    Discharge Plan or Barriers: CSW will assist with development of an appropriate aftercare/discharge plan.  Update 01/27/2024: No changes at this time.  Reason for Continuation of Hospitalization: Anxiety Depression Medication stabilization Suicidal ideation   Estimated Length of Stay: 1-7 days Update 01/27/2024: TBD  Last 3 [place] Suicide Severity Risk Score: Flowsheet Row Admission (Current) from 01/21/2024 in [facility] INPATIENT BEHAVIORAL MEDICINE Most recent reading at 01/21/2024  6:45 PM ED from 01/21/2024 in [facility] Most recent reading at 01/21/2024  3:26 AM ED from 10/08/2023 in [facility] Emergency Department at [facility] Most recent reading at 10/08/2023 11:02 PM  C-SSRS RISK CATEGORY High Risk High Risk High  Risk       Last PHQ 2/9 Scores:     No data to display          Scribe for Treatment Team: Marshell Levan, LCSW 01/27/2024 11:02 AM

## 2024-01-27 NOTE — Progress Notes (Signed)
[facility] MD Progress Note  Debbie Long  MRN:  [id number]  18 year old Caucasian female presents voluntarily to [facility] with her grandmother, [name], due to worsening depression and suicidal ideation. She reports feeling suicidal since age 76 and states she recently developed a plan to choke herself with a Advertising account planner. One week ago, she attempted suicide by taking five trazodone 100 mg tablets and a bottle of wine but did not seek medical attention. She also reports a history of self-harm, including cutting her throat with a razor blade a few weeks ago.  Subjective:  Chart reviewed, case discussed with multidisciplinary team, patient seen during rounds today. No acute events overnight.  Patient approach while lying in bed in her assigned room, reports that she is doing okay.  She did have some difficulty sleeping overnight because of some pain in her side, reports that she has received as needed ibuprofen which has helped to relieve some of the pain.  Reports good appetite, did have breakfast.  She is currently denying any anxiety, depression.  States that the last time she was feeling any anxiety/depression was yesterday.  Reports feeling this way because she was unable to be home for Valentine's Day with her dog.  She is denying SI/HI and AVH.  Denies having any flashbacks or nightmares since she was last seen.  Patient lives at home with her grandmother, reports that she can return there at discharge.  Have discussed with team, who have contacted patient's grandmother, she has no concerns with the patient returning home tomorrow.  Patient is alert and oriented x 4.  Affect is euthymic, calm and cooperative, polite.  Smiling at times.  Insight and judgment fair.   Diagnosis: Principal Problem:   MDD (major depressive disorder), recurrent severe, without psychosis (HCC) Active Problems:   PTSD (post-traumatic stress disorder)   Self-injurious behavior   Self-induced vomiting for weight loss    Borderline personality disorder (HCC)   Cannabis abuse   Alcohol use  Total Time spent with patient: 20 minutes  Past Psychiatric History: History of PTSD, DMDD, MDD and ADHD. Patient hospitalized at [facility] 07/10/23. Two intentional overdoses in the past 9 months. Past trial of Abilify.  Past Medical History:  Past Medical History:  Diagnosis Date   Anxiety    Asthma    Depression    Eating disorder    Migraine     Past Surgical History:  Procedure Laterality Date   TOE SURGERY     Family History: History reviewed. No pertinent family history. Family Psychiatric  History: Mother- bipolar disorder; brother- autism  Social History:  Social History   Substance and Sexual Activity  Alcohol Use Yes   Comment: socially     Social History   Substance and Sexual Activity  Drug Use Yes   Types: Marijuana    Social History   Socioeconomic History   Marital status: Single    Spouse name: Not on file   Number of children: Not on file   Years of education: Not on file   Highest education level: Not on file  Occupational History   Not on file  Tobacco Use   Smoking status: Some Days    Types: Cigarettes   Smokeless tobacco: Never  Vaping Use   Vaping status: Former  Substance and Sexual Activity   Alcohol use: Yes    Comment: socially   Drug use: Yes    Types: Marijuana   Sexual activity: Yes  Other Topics Concern  Not on file  Social History Narrative   Not on file   Social Drivers of Health   Financial Resource Strain: Low Risk  (08/25/2023)   Received from [facility]   Overall Financial Resource Strain (CARDIA)    Difficulty of Paying Living Expenses: Not hard at all  Food Insecurity: No Food Insecurity (01/21/2024)   Hunger Vital Sign    Worried About Running Out of Food in the Last Year: Never true    Ran Out of Food in the Last Year: Never true  Transportation Needs: No Transportation Needs (01/21/2024)   PRAPARE - Scientist, research (physical sciences) (Medical): No    Lack of Transportation (Non-Medical): No  Physical Activity: Not on file  Stress: Not on file  Social Connections: Unknown (04/25/2022)   Received from [facility]   Social Network    Social Network: Not on file   Social History: Patient resides with her (m) grandparents. Patient states that she graduated from high school in December 2024. She is unemployed.  Sleep: Fair  Appetite:  Poor  Current Medications: Current Facility-Administered Medications  Medication Dose Route Frequency Provider Last Rate Last Admin   acetaminophen (TYLENOL) tablet 650 mg  650 mg Oral Q6H PRN White, Patrice L, NP   650 mg at 01/24/24 0953   alum & mag hydroxide-simeth (MAALOX/MYLANTA) 200-200-20 MG/5ML suspension 30 mL  30 mL Oral Q4H PRN White, Patrice L, NP       cariprazine (VRAYLAR) capsule 3 mg  3 mg Oral Daily Remington, Amber E, NP   3 mg at 01/27/24 0846   haloperidol (HALDOL) tablet 5 mg  5 mg Oral TID PRN White, Patrice L, NP       And   diphenhydrAMINE (BENADRYL) capsule 50 mg  50 mg Oral TID PRN White, Patrice L, NP       diphenhydrAMINE (BENADRYL) injection 50 mg  50 mg Intramuscular TID PRN White, Patrice L, NP       FLUoxetine (PROZAC) capsule 60 mg  60 mg Oral Daily Remington, Amber E, NP   60 mg at 01/27/24 0846   folic acid (FOLVITE) tablet 1 mg  1 mg Oral Daily Myriam Forehand, NP   1 mg at 01/27/24 0846   guanFACINE (INTUNIV) ER tablet 4 mg  4 mg Oral QHS Remington, Amber E, NP   4 mg at 01/26/24 2146   haloperidol lactate (HALDOL) injection 5 mg  5 mg Intramuscular TID PRN Liborio Nixon L, NP       hydrOXYzine (ATARAX) tablet 25 mg  25 mg Oral TID PRN White, Patrice L, NP   25 mg at 01/23/24 2108   ibuprofen (ADVIL) tablet 400 mg  400 mg Oral Q8H PRN White, Patrice L, NP   400 mg at 01/27/24 0546   magnesium hydroxide (MILK OF MAGNESIA) suspension 30 mL  30 mL Oral Daily PRN White, Patrice L, NP       multivitamin with minerals tablet 1 tablet  1 tablet  Oral Daily Myriam Forehand, NP   1 tablet at 01/27/24 0846   nicotine polacrilex (NICORETTE) gum 2 mg  2 mg Oral PRN Myriam Forehand, NP       ondansetron (ZOFRAN-ODT) disintegrating tablet 4 mg  4 mg Oral Q8H PRN Myriam Forehand, NP   4 mg at 01/21/24 1809   polyethylene glycol (MIRALAX / GLYCOLAX) packet 17 g  17 g Oral Daily Remington, Amber E, NP   17 g  at 01/27/24 0846   thiamine (VITAMIN B1) tablet 100 mg  100 mg Oral Daily Myriam Forehand, NP   100 mg at 01/27/24 8756   Or   thiamine (VITAMIN B1) injection 100 mg  100 mg Intravenous Daily Myriam Forehand, NP       traZODone (DESYREL) tablet 50 mg  50 mg Oral QHS PRN White, Patrice L, NP   50 mg at 01/26/24 2147    Lab Results:  No results found for this or any previous visit (from the past 48 hours).   Blood Alcohol level:  Lab Results  Component Value Date   ETH <10 01/21/2024   ETH <10 01/21/2024    Metabolic Disorder Labs: Lab Results  Component Value Date   HGBA1C 5.2 01/21/2024   MPG 102.54 01/21/2024   MPG 91.06 11/03/2018    Lab Results  Component Value Date   CHOL 151 01/21/2024   TRIG 71 01/21/2024   HDL 49 01/21/2024   CHOLHDL 3.1 01/21/2024   VLDL 14 01/21/2024   LDLCALC 88 01/21/2024   LDLCALC 82 11/03/2018    Physical Findings: AIMS: Facial and Oral Movements Muscles of Facial Expression: None Lips and Perioral Area: None Jaw: None Tongue: None,Extremity Movements Upper (arms, wrists, hands, fingers): None Lower (legs, knees, ankles, toes): None, Trunk Movements Neck, shoulders, hips: None, Global Judgements Severity of abnormal movements overall : None Incapacitation due to abnormal movements: None Patient's awareness of abnormal movements: No Awareness,    CIWA:  CIWA-Ar Total: 4 COWS:     Musculoskeletal: Strength & Muscle Tone: within normal limits Gait & Station: normal Patient leans: N/A  Psychiatric Specialty Exam:  Presentation  General Appearance:  Appropriate for Environment; Well  Groomed  Eye Contact: Good  Speech: Normal Rate; Clear and Coherent  Speech Volume: Normal  Handedness: Right   Mood and Affect  Mood: Euthymic  Affect: Appropriate   Thought Process  Thought Processes: Coherent  Descriptions of Associations:Intact  Orientation:Full (Time, Place and Person)  Thought Content:Logical  History of Schizophrenia/Schizoaffective disorder:No  Duration of Psychotic Symptoms:N/A  Hallucinations:None  Ideas of Reference:None  Suicidal Thoughts: none   Homicidal Thoughts:No   Sensorium  Memory: Immediate Good; Recent Good; Remote Good  Judgment: Fair  Insight: Fair   Art therapist  Concentration: Good  Attention Span: Good  Recall: Good  Fund of Knowledge: Good  Language: Good   Psychomotor Activity  Psychomotor Activity: Normal   Assets  Assets: Communication Skills; Housing; Other (comment) (Family support)   Sleep  Sleep: Fair    Physical Exam: Physical Exam Vitals and nursing note reviewed.  HENT:     Head: Normocephalic and atraumatic.     Nose: Nose normal.     Mouth/Throat:     Mouth: Mucous membranes are moist.  Eyes:     Extraocular Movements: Extraocular movements intact.  Pulmonary:     Effort: Pulmonary effort is normal.  Musculoskeletal:        General: Normal range of motion.     Cervical back: Normal range of motion.  Skin:    General: Skin is warm and dry.  Neurological:     Mental Status: She is alert and oriented to person, place, and time.  Psychiatric:        Attention and Perception: Attention and perception normal.        Mood and Affect: Mood is not depressed. Affect is not flat.        Speech: Speech normal.  Behavior: Behavior is not withdrawn. Behavior is cooperative.        Thought Content: Thought content normal. Thought content does not include suicidal ideation.        Cognition and Memory: Cognition and memory normal.        Judgment:  Judgment normal.    Review of Systems  All other systems reviewed and are negative.  Blood pressure (!) 90/55, pulse 64, temperature 98.1 F (36.7 C), resp. rate 16, height 5\' 6"  (1.676 m), weight 71.7 kg, SpO2 100%. Body mass index is 25.5 kg/m.  PLAN:  Principal Problem:   MDD (major depressive disorder), recurrent severe, without psychosis (HCC) Active Problems:   PTSD (post-traumatic stress disorder)   Self-injurious behavior   Self-induced vomiting for weight loss   Borderline personality disorder (HCC)   Cannabis abuse   Alcohol use  1.    Safety and Monitoring:              --  Voluntary admission to inpatient psychiatric unit for safety, stabilization and treatment             -- Daily contact with patient to assess and evaluate symptoms and progress in treatment             -- Patient's case to be discussed in multi-disciplinary team meeting             -- Observation Level : q15 minute checks             -- Vital signs:  q6h x 48 hours, then q12 hours             -- Precautions: suicide  2. Psychiatric Diagnoses and Treatment:  -- Continue Prozac 60mg  PO daily for MDD and PTSD  -- Continue Intuniv 4mg  PO at bedtime for ADHD  -- Continue Vraylar 3mg  PO daily for MDD adjunct  -- Continue hydroxyzine 25mg  PO TID PRN anxiety  -- Continue trazodone 50mg  PO at bedtime PRN insomnia  -- Hold Strattera 25mg  (unavailable)  -- No longer on CIWA protocol.  -- Restrict patient access to room without staff supervision during meal times and group therapy, monitor for self-induced vomiting --  The risks/benefits/side-effects/alternatives to this medication were discussed in detail with the patient and time was given for questions. The patient consents to medication trial.             -- Metabolic profile and EKG monitoring obtained while on an atypical antipsychotic  (BMI: 25.5;  Lipid Panel: WNL; HbgA1c: 5.2; QTc: 433)             -- Encouraged patient to participate in unit  milieu and in scheduled group therapies             -- Short Term Goals: Ability to identify changes in lifestyle to reduce recurrence of condition will improve, Ability to verbalize feelings will improve, Ability to disclose and discuss suicidal ideas, Ability to demonstrate self-control will improve, Ability to identify and develop effective coping behaviors will improve, Ability to maintain clinical measurements within normal limits will improve, Compliance with prescribed medications will improve, and Ability to identify triggers associated with substance abuse/mental health issues will improve             -- Long Term Goals: Improvement in symptoms so as ready for discharge  3. Medical Issues Being Addressed:  -- Continue miralax 17g PO daily PRN for constipation  4. Discharge Planning:             --  Social work and case management to assist with discharge planning and identification of hospital follow-up needs prior to discharge             -- Estimated LOS: 5-7 days             -- Discharge Concerns: Need to establish a safety plan; Medication compliance and effectiveness             -- Discharge Goals: Return home with outpatient referrals for mental health follow-up including medication management/psychotherapy. Strongly recommend DBT  [employer], PA-C

## 2024-01-27 NOTE — [facility] Suicide Risk Assessment (Signed)
[facility] INPATIENT:  Family/Significant Other Suicide Prevention Education  Suicide Prevention Education:  Education Completed; Debbie Long, grandmother, [phone],  (name of family member/significant other) has been identified by the patient as the family member/significant other with whom the patient will be residing, and identified as the person(s) who will aid the patient in the event of a mental health crisis (suicidal ideations/suicide attempt).  With written consent from the patient, the family member/significant other has been provided the following suicide prevention education, prior to the and/or following the discharge of the patient.  The suicide prevention education provided includes the following: Suicide risk factors Suicide prevention and interventions National Suicide Hotline telephone number [facility] assessment telephone number [facility] Emergency Assistance 911 [facility] and/or Residential Mobile Crisis Unit telephone number  Request made of family/significant other to: Remove weapons (e.g., guns, rifles, knives), all items previously/currently identified as safety concern.   Remove drugs/medications (over-the-counter, prescriptions, illicit drugs), all items previously/currently identified as a safety concern.  The family member/significant other verbalizes understanding of the suicide prevention education information provided.  The family member/significant other agrees to remove the items of safety concern listed above.   The LCSWA contacted the patient grandmother to provide SPI. The patient grandma stated that she had no concerns about the patient being discharged. Grandma reported that there are no guns or weapons in the home.   Debbie Long 01/27/2024, 11:15 AM

## 2024-01-27 NOTE — Group Note (Signed)
Date:  01/27/2024 Time:  12:26 AM  Group Topic/Focus:  Recovery Goals:   The focus of this group is to identify appropriate goals for recovery and establish a plan to achieve them.    Participation Level:  Did Not Attend  Participation Quality:    Affect:    Cognitive:    Insight:   Engagement in Group:    Modes of Intervention:    Additional Comments:    Sal Spratley 01/27/2024, 12:26 AM

## 2024-01-28 NOTE — Progress Notes (Signed)
  [facility] Adult Case Management Discharge Plan :  Will you be returning to the same living situation after discharge:  Yes,  [address] [place] At discharge, do you have transportation home?: No. Do you have the ability to pay for your medications: Yes,  [employer] Plan  Release of information consent forms completed and in the chart;  Patient's signature needed at discharge.  Patient to Follow up at:  Follow-up Information     [facility]. Go to.   Why: Virutal appointment is 02/02/24 at 10 AM. You will receive a call at [phone]. Contact information: [address]  [address] [place] [address] [phone]         [facility] Go to.   Why: Medication management appointment with Merian Capron 02/16/24 at 2:40 PM. Contact information: [address] [place] [address] [phone]                 Next level of care provider has access to [facility] Link:yes  Safety Planning and Suicide Prevention discussed: Valentino Hue  [name], grandmother, [phone]     Has patient been referred to the Quitline?: Patient refused referral for treatment  Patient has been referred for addiction treatment: Yes, the patient will follow up with an outpatient provider for substance use disorder. Psychiatrist/APP: appointment made and PCP: appointment made  Marshell Levan, LCSW 01/28/2024, 9:58 AM

## 2024-01-28 NOTE — Progress Notes (Signed)
Patient ID: Debbie Long, female   DOB: [birth year], 18 y.o.   MRN: [id number]  Patient discharged home via taxi. Denies SI/HI/AVH. No distress noted.

## 2024-01-28 NOTE — Progress Notes (Signed)
   01/27/24 1948  Psych Admission Type (Psych Patients Only)  Admission Status Voluntary  Psychosocial Assessment  Patient Complaints None  Eye Contact Fair  Facial Expression Animated  Affect Appropriate to circumstance  Speech Logical/coherent  Interaction Assertive  Motor Activity Slow  Appearance/Hygiene Unremarkable  Behavior Characteristics Cooperative  Mood Pleasant  Thought Process  Coherency WDL  Content WDL  Delusions None reported or observed  Perception WDL  Hallucination None reported or observed  Judgment Impaired  Confusion None  Danger to Self  Current suicidal ideation?  (Denies)  Agreement Not to Harm Self Yes  Description of Agreement verbal

## 2024-01-28 NOTE — Plan of Care (Signed)
  Problem: Education: Goal: Knowledge of [place] [facility] Education information/materials will improve Outcome: Adequate for Discharge Goal: Emotional status will improve Outcome: Adequate for Discharge Goal: Mental status will improve Outcome: Adequate for Discharge Goal: Verbalization of understanding the information provided will improve Outcome: Adequate for Discharge   Problem: Activity: Goal: Interest or engagement in activities will improve Outcome: Adequate for Discharge Goal: Sleeping patterns will improve Outcome: Adequate for Discharge   Problem: Coping: Goal: Ability to verbalize frustrations and anger appropriately will improve Outcome: Adequate for Discharge Goal: Ability to demonstrate self-control will improve Outcome: Adequate for Discharge   Problem: Health Behavior/Discharge Planning: Goal: Identification of resources available to assist in meeting health care needs will improve Outcome: Adequate for Discharge Goal: Compliance with treatment plan for underlying cause of condition will improve Outcome: Adequate for Discharge   Problem: Physical Regulation: Goal: Ability to maintain clinical measurements within normal limits will improve Outcome: Adequate for Discharge   Problem: Safety: Goal: Periods of time without injury will increase Outcome: Adequate for Discharge   Problem: Coping: Goal: Coping ability will improve Outcome: Adequate for Discharge Goal: Will verbalize feelings Outcome: Adequate for Discharge   Problem: Self-Concept: Goal: Ability to identify factors that promote anxiety will improve Outcome: Adequate for Discharge Goal: Level of anxiety will decrease Outcome: Adequate for Discharge Goal: Ability to modify response to factors that promote anxiety will improve Outcome: Adequate for Discharge   Problem: Physical Regulation: Goal: Complications related to the disease process, condition or treatment will be avoided or  minimized Outcome: Adequate for Discharge   Problem: Safety: Goal: Ability to remain free from injury will improve Outcome: Adequate for Discharge   Problem: Health Behavior/Discharge Planning: Goal: Identification of resources available to assist in meeting health care needs will improve Outcome: Adequate for Discharge

## 2024-01-28 NOTE — Discharge Summary (Signed)
Physician Discharge Summary Note  Patient:  Debbie Long is an 18 y.o., female MRN:  [id number] DOB:  Nov 20, 2006 Patient phone:  [phone] (home)  Patient address:   [address] [place] [address],  Total Time spent with patient: 30 minutes  Date of Admission:  01/21/2024 Date of Discharge: 01/28/2024  Reason for Admission:  18 year old Caucasian female presented voluntarily to [facility] with her grandmother, [name], due to worsening depression and suicidal ideation. She reported feeling suicidal since age 68 and states she recently developed a plan to choke herself with a Advertising account planner. One week ago, she attempted suicide by taking five trazodone 100 mg tablets and a bottle of wine but did not seek medical attention. She also reports a history of self-harm, including cutting her throat with a razor blade a few weeks ago.Patient states she has been drinking a bottle of wine and using marijuana (two blunts and gummies) nightly for the past week and expresses a desire to detox. She identifies multiple stressors, including her mother's recent move, a breakup, her grandfather's critical illness, and concerns about her elderly dog. She struggles with body image and reports engaging in purging behaviors, stating, "Sometimes I stick my finger down my throat to throw up. I want to be skinny."Patient is currently linked to Merian Capron for medication management but reports inconsistent adherence. She has a pending therapy appointment with Doran Heater in April 2025. She states she did not feel safe to return home. Her grandmother supports this concern and does not believe the patient will be safe if discharged.   Principal Problem: MDD (major depressive disorder), recurrent severe, without psychosis (HCC) Discharge Diagnoses: Principal Problem:   MDD (major depressive disorder), recurrent severe, without psychosis (HCC) Active Problems:   PTSD (post-traumatic stress disorder)   Self-induced  vomiting for weight loss   Borderline personality disorder (HCC)   Cannabis abuse   Alcohol use   Past Psychiatric History: History of PTSD, DMDD, MDD and ADHD. Patient hospitalized at [facility] 07/10/[facility]. Two  suicide attempts in the past by intentional overdose in the last 9 months. Past trial of Abilify.   Past Medical History:  Past Medical History:  Diagnosis Date   Anxiety    Asthma    Depression    Eating disorder    Migraine     Past Surgical History:  Procedure Laterality Date   TOE SURGERY     Family History: History reviewed. No pertinent family history. Family Psychiatric  History: Mother- bipolar disorder; brother- autism  Social History:  Social History   Substance and Sexual Activity  Alcohol Use Yes   Comment: socially     Social History   Substance and Sexual Activity  Drug Use Yes   Types: Marijuana    Social History   Socioeconomic History   Marital status: Single    Spouse name: Not on file   Number of children: Not on file   Years of education: Not on file   Highest education level: Not on file  Occupational History   Not on file  Tobacco Use   Smoking status: Some Days    Types: Cigarettes   Smokeless tobacco: Never  Vaping Use   Vaping status: Former  Substance and Sexual Activity   Alcohol use: Yes    Comment: socially   Drug use: Yes    Types: Marijuana   Sexual activity: Yes  Other Topics Concern   Not on file  Social History Narrative  Not on file   Social Drivers of Health   Financial Resource Strain: Low Risk  (08/25/2023)   Received from [facility]   Overall Financial Resource Strain (CARDIA)    Difficulty of Paying Living Expenses: Not hard at all  Food Insecurity: No Food Insecurity (01/21/2024)   Hunger Vital Sign    Worried About Running Out of Food in the Last Year: Never true    Ran Out of Food in the Last Year: Never true  Transportation Needs: No Transportation Needs (01/21/2024)   PRAPARE - Therapist, art (Medical): No    Lack of Transportation (Non-Medical): No  Physical Activity: Not on file  Stress: Not on file  Social Connections: Unknown (04/25/2022)   Received from [facility]   Social Network    Social Network: Not on file    Hospital Course:   During the course of hospitalization, pt received daily multiple modalities of treatments consisting of Psychopharmacology, individual, group, psychoeducational, recreational, milieu therapy, including case management to coordinate pts inpatient and outpatient care and in concert with weekly treatment team meetings. Discharge planning was initiated on the day of admission to ensure a safe discharge. The presenting symptoms were closely monitored and medications were started as indicated. There were no complications. The principal reasons for hospitalization consisted of worsening depression, suicidal ideations, self harm behaviors, cannabis abuse, alcohol use  Medications addressing the principal problem were initiated with improvement in severity sufficient to discharge to a lower level of care.  Patient was started on Vraylar 1.5 mg daily for mood stabilization, Prozac 40 mg daily for MDD/PTSD.  Patient was also started on CIWA protocol upon being admitted to the inpatient behavioral health unit, was placed on eating disorder monitoring as she reportedly was purging after eating.  Home medication Strattera was discontinued as it is not on formulary here at the hospital. On 02/10 patient instead started on Intuniv 4 mg at bedtime for ADHD, Vraylar was also increased to 3 mg daily for MDD adjunct.   It is intended for the outpatient provider to determine whether to continue these medications, or if these medication needs to be titrated for continued outpatient therapy.  All identified psychiatric, general medical/surgical psychosocial obstacles to discharge were addressed. Patient tolerated these medications with no noted side  effects. All these medications were titrated to discharge levels (Please see discharge medications below). Patient showed slow but steady and sustained symptomatic improvement before discharge. The patient denied suicidal, homicidal ideations and hallucinations. Family contacted to determine baseline behaviors and for safe discharge plan.  On the day of discharge 01/28/2024, following sustained improvement in the affect of this patient, continued report of euthymic mood, repeated denial of suicidal, homicidal and other violent ideations, adequate interaction with peers, active participation in groups while on the unit, and denial of adverse reactions from the medications, the treatment team decided that Marshe, Shrestha was stable for discharge back  home with scheduled mental health treatment as below. A comprehensive risk assessment was done prior to discharge and shows that patient is at low risk for suicide or violence and will continue to be if patient complies with the treatment recommendations, medications and therapy. Patient agrees to call Crisis Services, 911 and/or return to the ED if safety cannot be maintained outside the hospital setting. Discharge medications reviewed with patient, explanation of indication, risks/benefits and side effects profiles. The patient verbalized understanding and is in agreement with the discharge plan.   Physical Findings: AIMS:  Facial and Oral Movements Muscles of Facial Expression: None Lips and Perioral Area: None Jaw: None Tongue: None,Extremity Movements Upper (arms, wrists, hands, fingers): None Lower (legs, knees, ankles, toes): None, Trunk Movements Neck, shoulders, hips: None, Global Judgements Severity of abnormal movements overall : None Incapacitation due to abnormal movements: None Patient's awareness of abnormal movements: No Awareness,    CIWA:  CIWA-Ar Total: 4 COWS:     Musculoskeletal: Strength & Muscle Tone: within normal limits Gait &  Station: normal Patient leans: Right   Psychiatric Specialty Exam:  Presentation  General Appearance:  Appropriate for Environment  Eye Contact: Good  Speech: Normal Rate  Speech Volume: Normal  Handedness: Right   Mood and Affect  Mood: Anxious  Affect: Appropriate   Thought Process  Thought Processes: Coherent  Descriptions of Associations:Intact  Orientation:Full (Time, Place and Person)  Thought Content:Logical  History of Schizophrenia/Schizoaffective disorder:No  Duration of Psychotic Symptoms:N/A  Hallucinations:Hallucinations: None  Ideas of Reference:None  Suicidal Thoughts:Suicidal Thoughts: No  Homicidal Thoughts:Homicidal Thoughts: No   Sensorium  Memory: Immediate Good; Recent Good; Remote Good  Judgment: Good  Insight: Good   Executive Functions  Concentration: Good  Attention Span: Good  Recall: Good  Fund of Knowledge: Good  Language: Good   Psychomotor Activity  Psychomotor Activity: Psychomotor Activity: Normal   Assets  Assets: Communication Skills; Housing; Social Support   Sleep  Sleep: Sleep: Good    Physical Exam: Physical Exam Constitutional:      Appearance: Normal appearance.  HENT:     Head: Normocephalic and atraumatic.  Eyes:     Pupils: Pupils are equal, round, and reactive to light.  Pulmonary:     Effort: Pulmonary effort is normal.  Musculoskeletal:        General: Normal range of motion.     Cervical back: Normal range of motion.  Skin:    General: Skin is warm.  Neurological:     General: No focal deficit present.     Mental Status: She is alert and oriented to person, place, and time.  Psychiatric:        Attention and Perception: Attention and perception normal.        Mood and Affect: Affect normal. Mood is anxious.        Speech: Speech normal.        Behavior: Behavior normal. Behavior is cooperative.        Thought Content: Thought content normal.         Cognition and Memory: Cognition and memory normal.     Comments: Judgement fair  Reports anxious mood related to behavioral episode on unit by another pt     Review of Systems  Psychiatric/Behavioral:  The patient is nervous/anxious.   All other systems reviewed and are negative.  Blood pressure (!) 92/57, pulse (!) 58, temperature 98 F (36.7 C), resp. rate 18, height 5\' 6"  (1.676 m), weight 71.7 kg, SpO2 100%. Body mass index is 25.5 kg/m.   Social History   Tobacco Use  Smoking Status Some Days   Types: Cigarettes  Smokeless Tobacco Never   Tobacco Cessation:  A prescription for an FDA-approved tobacco cessation medication provided at discharge   Blood Alcohol level:  Lab Results  Component Value Date   [facility] <10 01/21/2024   ETH <10 01/21/2024    Metabolic Disorder Labs:  Lab Results  Component Value Date   HGBA1C 5.2 01/21/2024   MPG 102.54 01/21/2024   MPG 91.06 11/03/2018   No  results found for: "PROLACTIN" Lab Results  Component Value Date   CHOL 151 01/21/2024   TRIG 71 01/21/2024   HDL 49 01/21/2024   CHOLHDL 3.1 01/21/2024   VLDL 14 01/21/2024   LDLCALC 88 01/21/2024   LDLCALC 82 11/03/2018    See Psychiatric Specialty Exam and Suicide Risk Assessment completed by Attending Physician prior to discharge.  Discharge destination:  Home  Is patient on multiple antipsychotic therapies at discharge:  No   Has Patient had three or more failed trials of antipsychotic monotherapy by history:  No  Recommended Plan for Multiple Antipsychotic Therapies: NA  Discharge Instructions     Diet general   Complete by: As directed       Allergies as of 01/28/2024       Reactions   Cherry Nausea And Vomiting, Other (See Comments)   Headaches and migraines   Lactose Intolerance (gi) Diarrhea, Other (See Comments)   Major bloating        Medication List     TAKE these medications      Indication  albuterol 108 (90 Base) MCG/ACT inhaler Commonly  known as: VENTOLIN HFA Inhale 2 puffs into the lungs every 4 (four) hours as needed for wheezing or shortness of breath.  Indication: Asthma   cariprazine 3 MG capsule Commonly known as: Vraylar Take 1 capsule (3 mg total) by mouth daily. What changed:  medication strength how much to take when to take this  Indication: Major Depressive Disorder   FLUoxetine 20 MG capsule Commonly known as: PROZAC Take 3 capsules (60 mg total) by mouth daily. What changed: how much to take  Indication: Binge Eating Disorder, Depression, Major Depressive Disorder   guanFACINE 4 MG Tb24 ER tablet Commonly known as: INTUNIV Take 1 tablet (4 mg total) by mouth at bedtime.  Indication: Attention Deficit Hyperactivity Disorder   nicotine polacrilex 2 MG gum Commonly known as: NICORETTE Take 1 each (2 mg total) by mouth as needed for smoking cessation.  Indication: Nicotine Addiction        Follow-up Information     [facility]. Go to.   Why: Virutal appointment is 02/02/24 at 10 AM. You will receive a call at [phone]. Contact information: [address]  [address] [place] [address] [phone]         [facility] Go to.   Why: Medication management appointment with Merian Capron 02/16/24 at 2:40 PM. Contact information: [address] [place] [address] [phone]                 Follow-up recommendations:    # It is recommended to the patient to continue psychiatric medications as prescribed, after discharge from the hospital.   # It is recommended to the patient to follow up with your outpatient psychiatric provider and PCP. # It was discussed with the patient, the impact of alcohol, drugs, tobacco have been there overall psychiatric and medical wellbeing, and total abstinence from substance use was recommended. # Prescriptions provided or sent directly to preferred pharmacy at discharge.  # In the event  of worsening symptoms, the patient is instructed to call the crisis hotline (988), 911 and or go to the nearest ED for appropriate evaluation and treatment of symptoms. To follow-up with primary care provider for other medical issues, concerns and or health care needs # Patient was discharged home to her grandmother's home with a plan to follow up as noted below.  Follow-up Information       [facility]. Go to.   Why: Virutal appointment is 02/02/24 at 10 AM. You will receive a call at [phone]. Contact information: [address]  [address] [place] [address] [phone]              [facility] Go to.   Why: Medication management appointment with Merian Capron 02/16/24 at 2:40 PM. Contact information: [address] [place] [address] [phone]       Signed: Paulene Floor, PA-C 01/28/2024, 2:03 PM

## 2024-01-28 NOTE — Plan of Care (Signed)
   Problem: Education: Goal: Knowledge of [place] [facility] Education information/materials will improve Outcome: Progressing Goal: Emotional status will improve Outcome: Progressing

## 2024-01-28 NOTE — Progress Notes (Signed)
   01/28/24 0814  Psych Admission Type (Psych Patients Only)  Admission Status Voluntary  Psychosocial Assessment  Patient Complaints None  Eye Contact Fair  Facial Expression Flat  Affect Sad  Speech Soft  Interaction Assertive  Motor Activity Slow  Appearance/Hygiene Unremarkable  Behavior Characteristics Cooperative  Mood Pleasant;Sad  Thought Process  Coherency WDL  Content WDL  Delusions None reported or observed  Perception WDL  Hallucination None reported or observed  Judgment Poor  Confusion None  Danger to Self  Current suicidal ideation? Denies  Agreement Not to Harm Self Yes  Description of Agreement Verbal  Danger to Others  Danger to Others None reported or observed

## 2024-03-10 ENCOUNTER — Ambulatory Visit (HOSPITAL_COMMUNITY)
Admission: EM | Admit: 2024-03-10 | Discharge: 2024-03-10 | Disposition: A | Discharge status: Home / Self Care | Payer: MEDICAID

## 2024-03-10 ENCOUNTER — Other Ambulatory Visit: Payer: Self-pay

## 2024-03-10 ENCOUNTER — Encounter (HOSPITAL_COMMUNITY): Payer: Self-pay | Admitting: *Deleted

## 2024-03-10 DIAGNOSIS — N898 Other specified noninflammatory disorders of vagina: Secondary | ICD-10-CM | POA: Insufficient documentation

## 2024-03-10 DIAGNOSIS — Z3202 Encounter for pregnancy test, result negative: Secondary | ICD-10-CM | POA: Diagnosis not present

## 2024-03-10 DIAGNOSIS — Z113 Encounter for screening for infections with a predominantly sexual mode of transmission: Secondary | ICD-10-CM | POA: Insufficient documentation

## 2024-03-10 LAB — HIV ANTIBODY (ROUTINE TESTING W REFLEX): HIV Screen 4th Generation wRfx: NONREACTIVE

## 2024-03-10 LAB — POCT URINE PREGNANCY: Preg Test, Ur: NEGATIVE

## 2024-03-10 NOTE — Discharge Instructions (Signed)
 1. Vaginal discharge (Primary) - Cervicovaginal collected in UC sent to lab for further testing results should be available in 1 to 2 days - POCT urine pregnancy performed in UC is negative for hCG, negative pregnancy  2. Screening for STD (sexually transmitted disease) - HIV Antibody (routine testing w rflx) collected in UC sent to lab for further testing - RPR collected in UC for screening for syphilis, sent to lab for further testing. -Results should be available in 1 to 2 days via MyChart if any positive results you will be contacted appropriate guidance provided.

## 2024-03-10 NOTE — ED Provider Notes (Signed)
 [facility]  Note:  This document was prepared using Dragon voice recognition software and may include unintentional dictation errors.  MRN: [id number] DOB: 06-24-2006  Subjective:   Debbie Long is a 18 y.o. female presenting for for vaginal discharge following unprotected sex.  Patient reports that she noticed thin clear vaginal discharge and fishy smell coming from her vagina.  Patient reports having unprotected sex and is concern for other STDs as well.  Patient reports past history of vaginal Candida and states that this is different than when she had vaginal Candida.  Patient denies any dysuria, increased urinary frequency, abdominal pain, flank pain.  No current facility-administered medications for this encounter.  Current Outpatient Medications:    FLUoxetine (PROZAC) 20 MG capsule, Take 3 capsules (60 mg total) by mouth daily., Disp: 90 capsule, Rfl: 0   guanFACINE (INTUNIV) 4 MG TB24 ER tablet, Take 1 tablet (4 mg total) by mouth at bedtime., Disp: 30 tablet, Rfl: 0   albuterol (PROVENTIL HFA;VENTOLIN HFA) 108 (90 Base) MCG/ACT inhaler, Inhale 2 puffs into the lungs every 4 (four) hours as needed for wheezing or shortness of breath., Disp: , Rfl:    cariprazine (VRAYLAR) 3 MG capsule, Take 1 capsule (3 mg total) by mouth daily., Disp: 30 capsule, Rfl: 0   hydrOXYzine (ATARAX) 25 MG tablet, Take 40 mg by mouth in the morning and at bedtime., Disp: , Rfl:    nicotine polacrilex (NICORETTE) 2 MG gum, Take 1 each (2 mg total) by mouth as needed for smoking cessation., Disp: 100 tablet, Rfl: 0   Allergies  Allergen Reactions   Cherry Nausea And Vomiting and Other (See Comments)    Headaches and migraines   Lactose Intolerance (Gi) Diarrhea and Other (See Comments)    Major bloating    Past Medical History:  Diagnosis Date   Anxiety    Asthma    Depression    Eating disorder    Migraine      Past Surgical History:  Procedure Laterality Date   TOE SURGERY       History reviewed. No pertinent family history.  Social History   Tobacco Use   Smoking status: Some Days    Types: Cigarettes   Smokeless tobacco: Never  Vaping Use   Vaping status: Former  Substance Use Topics   Alcohol use: Yes    Comment: socially   Drug use: Yes    Types: Marijuana    ROS Refer to HPI for ROS details.  Objective:   Vitals: BP 137/84   Pulse (!) 118   Temp 98.4 F (36.9 C)   Resp 20   SpO2 96%   Physical Exam Vitals and nursing note reviewed.  Constitutional:      General: She is not in acute distress.    Appearance: Normal appearance. She is well-developed. She is not ill-appearing or toxic-appearing.  HENT:     Head: Normocephalic.  Cardiovascular:     Rate and Rhythm: Normal rate.  Pulmonary:     Effort: Pulmonary effort is normal. No respiratory distress.  Abdominal:     General: There is no distension.     Palpations: Abdomen is soft.     Tenderness: There is no abdominal tenderness. There is no guarding or rebound.  Genitourinary:    Vagina: Vaginal discharge (Thin clear vaginal discharge with fishy odor described by patient.) present.  Skin:    General: Skin is warm and dry.  Neurological:     General: No focal  deficit present.     Mental Status: She is alert and oriented to person, place, and time.  Psychiatric:        Mood and Affect: Mood normal.        Behavior: Behavior normal.     Procedures  Results for orders placed or performed during the hospital encounter of 03/10/24 (from the past 24 hours)  POCT urine pregnancy     Status: None   Collection Time: 03/10/24  4:04 PM  Result Value Ref Range   Preg Test, Ur Negative Negative    Assessment and Plan :   PDMP not reviewed this encounter.  1. Vaginal discharge   2. Screening for STD (sexually transmitted disease)    1. Vaginal discharge (Primary) - Cervicovaginal collected in UC sent to lab for further testing results should be available in 1 to 2 days -  POCT urine pregnancy performed in UC is negative for hCG, negative pregnancy  2. Screening for STD (sexually transmitted disease) - HIV Antibody (routine testing w rflx) collected in UC sent to lab for further testing - RPR collected in UC for screening for syphilis, sent to lab for further testing. -Results should be available in 1 to 2 days via MyChart if any positive results you will be contacted appropriate guidance provided. -Continue to monitor symptoms for any change in severity if there is any escalation of current symptoms or development of new symptoms follow-up in ER for further evaluation and management.  Lucky Cowboy   [place], [place], [place] 03/10/24 [phone]

## 2024-03-10 NOTE — ED Triage Notes (Signed)
 PT reports having unprotected sex and now has vag odor  with discharge.

## 2024-03-11 ENCOUNTER — Telehealth (HOSPITAL_COMMUNITY): Payer: Self-pay

## 2024-03-11 LAB — CERVICOVAGINAL ANCILLARY ONLY
Bacterial Vaginitis (gardnerella): POSITIVE — AB
Candida Glabrata: NEGATIVE
Candida Vaginitis: NEGATIVE
Chlamydia: POSITIVE — AB
Comment: NEGATIVE
Comment: NEGATIVE
Comment: NEGATIVE
Comment: NEGATIVE
Comment: NEGATIVE
Comment: NORMAL
Neisseria Gonorrhea: NEGATIVE
Trichomonas: NEGATIVE

## 2024-03-11 LAB — RPR: RPR Ser Ql: NONREACTIVE

## 2024-03-11 MED ORDER — DOXYCYCLINE HYCLATE 100 MG PO TABS: 100.0000 mg | ORAL_TABLET | Freq: Two times a day (BID) | ORAL | 0 refills | Status: AC

## 2024-03-11 MED ORDER — METRONIDAZOLE 500 MG PO TABS: 500.0000 mg | ORAL_TABLET | Freq: Two times a day (BID) | ORAL | 0 refills | Status: AC

## 2024-03-11 NOTE — Telephone Encounter (Signed)
 Per protocol, pt requires tx with metronidazole and Doxycycline. Reviewed with patient, verified pharmacy, prescription sent.

## 2024-03-19 ENCOUNTER — Ambulatory Visit (INDEPENDENT_AMBULATORY_CARE_PROVIDER_SITE_OTHER): Payer: MEDICAID | Admitting: Clinical

## 2024-03-19 ENCOUNTER — Encounter (HOSPITAL_COMMUNITY): Payer: Self-pay

## 2024-03-19 DIAGNOSIS — F332 Major depressive disorder, recurrent severe without psychotic features: Secondary | ICD-10-CM | POA: Diagnosis not present

## 2024-03-19 DIAGNOSIS — F4310 Post-traumatic stress disorder, unspecified: Secondary | ICD-10-CM

## 2024-03-19 DIAGNOSIS — F1210 Cannabis abuse, uncomplicated: Secondary | ICD-10-CM
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
## 2024-05-28 ENCOUNTER — Encounter (HOSPITAL_COMMUNITY): Payer: Self-pay | Admitting: Psychiatry

## 2024-05-28 ENCOUNTER — Other Ambulatory Visit: Payer: Self-pay

## 2024-05-28 ENCOUNTER — Inpatient Hospital Stay (HOSPITAL_COMMUNITY)
Admission: AD | Admit: 2024-05-28 | Discharge: 2024-06-01 | DRG: 885 | Disposition: A | Discharge status: Home / Self Care | Payer: MEDICAID | Source: Intra-hospital | Attending: Psychiatry | Admitting: Psychiatry

## 2024-05-28 ENCOUNTER — Ambulatory Visit (HOSPITAL_COMMUNITY)
Admission: EM | Admit: 2024-05-28 | Discharge: 2024-05-28 | Disposition: A | Discharge status: Other Acute Inpatient Hospital | Payer: MEDICAID | Attending: Psychiatry | Admitting: Psychiatry

## 2024-05-28 DIAGNOSIS — R45851 Suicidal ideations: Secondary | ICD-10-CM | POA: Diagnosis present

## 2024-05-28 DIAGNOSIS — Z91148 Patient's other noncompliance with medication regimen for other reason: Secondary | ICD-10-CM

## 2024-05-28 DIAGNOSIS — F17210 Nicotine dependence, cigarettes, uncomplicated: Secondary | ICD-10-CM | POA: Diagnosis present

## 2024-05-28 DIAGNOSIS — F902 Attention-deficit hyperactivity disorder, combined type: Secondary | ICD-10-CM | POA: Diagnosis present

## 2024-05-28 DIAGNOSIS — G4700 Insomnia, unspecified: Secondary | ICD-10-CM | POA: Diagnosis present

## 2024-05-28 DIAGNOSIS — F1010 Alcohol abuse, uncomplicated: Secondary | ICD-10-CM | POA: Diagnosis present

## 2024-05-28 DIAGNOSIS — F339 Major depressive disorder, recurrent, unspecified: Secondary | ICD-10-CM | POA: Insufficient documentation

## 2024-05-28 DIAGNOSIS — J45909 Unspecified asthma, uncomplicated: Secondary | ICD-10-CM | POA: Diagnosis present

## 2024-05-28 DIAGNOSIS — F3132 Bipolar disorder, current episode depressed, moderate: Secondary | ICD-10-CM | POA: Diagnosis not present

## 2024-05-28 DIAGNOSIS — F603 Borderline personality disorder: Secondary | ICD-10-CM | POA: Diagnosis present

## 2024-05-28 DIAGNOSIS — Z79899 Other long term (current) drug therapy: Secondary | ICD-10-CM | POA: Diagnosis not present

## 2024-05-28 DIAGNOSIS — F419 Anxiety disorder, unspecified: Secondary | ICD-10-CM | POA: Diagnosis present

## 2024-05-28 DIAGNOSIS — F3130 Bipolar disorder, current episode depressed, mild or moderate severity, unspecified: Secondary | ICD-10-CM | POA: Diagnosis present

## 2024-05-28 DIAGNOSIS — F909 Attention-deficit hyperactivity disorder, unspecified type: Secondary | ICD-10-CM | POA: Insufficient documentation

## 2024-05-28 DIAGNOSIS — Z62810 Personal history of physical and sexual abuse in childhood: Secondary | ICD-10-CM | POA: Diagnosis not present

## 2024-05-28 DIAGNOSIS — Z9151 Personal history of suicidal behavior: Secondary | ICD-10-CM | POA: Diagnosis not present

## 2024-05-28 DIAGNOSIS — Z560 Unemployment, unspecified: Secondary | ICD-10-CM | POA: Diagnosis not present

## 2024-05-28 DIAGNOSIS — F331 Major depressive disorder, recurrent, moderate: Secondary | ICD-10-CM

## 2024-05-28 DIAGNOSIS — F4310 Post-traumatic stress disorder, unspecified: Secondary | ICD-10-CM | POA: Diagnosis present

## 2024-05-28 DIAGNOSIS — F332 Major depressive disorder, recurrent severe without psychotic features: Secondary | ICD-10-CM | POA: Diagnosis present

## 2024-05-28 DIAGNOSIS — F1210 Cannabis abuse, uncomplicated: Secondary | ICD-10-CM | POA: Diagnosis present

## 2024-05-28 DIAGNOSIS — F3481 Disruptive mood dysregulation disorder: Secondary | ICD-10-CM | POA: Diagnosis present

## 2024-05-28 DIAGNOSIS — F1220 Cannabis dependence, uncomplicated: Secondary | ICD-10-CM | POA: Diagnosis not present

## 2024-05-28 LAB — COMPREHENSIVE METABOLIC PANEL WITH GFR
ALT: 18 U/L (ref 0–44)
AST: 19 U/L (ref 15–41)
Albumin: 4.2 g/dL (ref 3.5–5.0)
Alkaline Phosphatase: 77 U/L (ref 38–126)
Anion gap: 9 (ref 5–15)
BUN: 8 mg/dL (ref 6–20)
CO2: 25 mmol/L (ref 22–32)
Calcium: 9.4 mg/dL (ref 8.9–10.3)
Chloride: 106 mmol/L (ref 98–111)
Creatinine, Ser: 0.71 mg/dL (ref 0.44–1.00)
GFR, Estimated: >60 mL/min (ref >60)
Glucose, Bld: 96 mg/dL (ref 70–99)
Potassium: 4.2 mmol/L (ref 3.5–5.1)
Sodium: 140 mmol/L (ref 135–145)
Total Bilirubin: 0.5 mg/dL (ref 0.0–1.2)
Total Protein: 7 g/dL (ref 6.5–8.1)

## 2024-05-28 LAB — CBC WITH DIFFERENTIAL/PLATELET
Abs Immature Granulocytes: 0.03 10*3/uL (ref 0.00–0.07)
Basophils Absolute: 0.1 10*3/uL (ref 0.0–0.1)
Basophils Relative: 1 %
Eosinophils Absolute: 0.1 10*3/uL (ref 0.0–0.5)
Eosinophils Relative: 1 %
HCT: 45.4 % (ref 36.0–46.0)
Hemoglobin: 14.8 g/dL (ref 12.0–15.0)
Immature Granulocytes: 0 %
Lymphocytes Relative: 18 %
Lymphs Abs: 1.9 10*3/uL (ref 0.7–4.0)
MCH: 27.8 pg (ref 26.0–34.0)
MCHC: 32.6 g/dL (ref 30.0–36.0)
MCV: 85.2 fL (ref 80.0–100.0)
Monocytes Absolute: 0.5 10*3/uL (ref 0.1–1.0)
Monocytes Relative: 5 %
Neutro Abs: 8.3 10*3/uL — ABNORMAL HIGH (ref 1.7–7.7)
Neutrophils Relative %: 75 %
Platelets: 413 10*3/uL — ABNORMAL HIGH (ref 150–400)
RBC: 5.33 MIL/uL — ABNORMAL HIGH (ref 3.87–5.11)
RDW: 12.9 % (ref 11.5–15.5)
WBC: 10.9 10*3/uL — ABNORMAL HIGH (ref 4.0–10.5)
nRBC: 0 % (ref 0.0–0.2)

## 2024-05-28 LAB — HEMOGLOBIN A1C
Hgb A1c MFr Bld: 5.3 % (ref 4.8–5.6)
Mean Plasma Glucose: 105.41 mg/dL

## 2024-05-28 LAB — POCT URINE DRUG SCREEN - MANUAL ENTRY (I-SCREEN)
POC Amphetamine UR: NOT DETECTED
POC Buprenorphine (BUP): NOT DETECTED
POC Cocaine UR: NOT DETECTED
POC Marijuana UR: POSITIVE — AB
POC Methadone UR: NOT DETECTED
POC Methamphetamine UR: NOT DETECTED
POC Morphine: NOT DETECTED
POC Oxazepam (BZO): NOT DETECTED
POC Oxycodone UR: NOT DETECTED
POC Secobarbital (BAR): NOT DETECTED

## 2024-05-28 LAB — LIPID PANEL
Cholesterol: 112 mg/dL (ref 0–169)
HDL: 32 mg/dL — ABNORMAL LOW (ref >40)
LDL Cholesterol: 65 mg/dL (ref 0–99)
Total CHOL/HDL Ratio: 3.5 ratio
Triglycerides: 76 mg/dL (ref <150)
VLDL: 15 mg/dL (ref 0–40)

## 2024-05-28 LAB — VITAMIN D 25 HYDROXY (VIT D DEFICIENCY, FRACTURES): Vit D, 25-Hydroxy: 34.25 ng/mL (ref 30–100)

## 2024-05-28 LAB — ETHANOL: Alcohol, Ethyl (B): <15 mg/dL (ref <15)

## 2024-05-28 LAB — POC URINE PREG, ED: Preg Test, Ur: NEGATIVE

## 2024-05-28 LAB — TSH: TSH: 0.26 u[IU]/mL — ABNORMAL LOW (ref 0.350–4.500)

## 2024-05-28 MED ORDER — NICOTINE 21 MG/24HR TD PT24: 21.0000 mg | MEDICATED_PATCH | Freq: Every day | TRANSDERMAL | Status: DC

## 2024-05-28 MED ORDER — HALOPERIDOL LACTATE 5 MG/ML IJ SOLN: 10.0000 mg | Freq: Three times a day (TID) | INTRAMUSCULAR | Status: DC | PRN

## 2024-05-28 MED ORDER — HALOPERIDOL 5 MG PO TABS: 5.0000 mg | ORAL_TABLET | Freq: Three times a day (TID) | ORAL | Status: DC | PRN

## 2024-05-28 MED ORDER — ACETAMINOPHEN 325 MG PO TABS: 650.0000 mg | ORAL_TABLET | Freq: Four times a day (QID) | ORAL | Status: DC | PRN

## 2024-05-28 MED ORDER — HALOPERIDOL LACTATE 5 MG/ML IJ SOLN: 5.0000 mg | Freq: Three times a day (TID) | INTRAMUSCULAR | Status: DC | PRN

## 2024-05-28 MED ORDER — DIPHENHYDRAMINE HCL 50 MG PO CAPS: 50.0000 mg | ORAL_CAPSULE | Freq: Three times a day (TID) | ORAL | Status: DC | PRN

## 2024-05-28 MED ORDER — DIPHENHYDRAMINE HCL 50 MG/ML IJ SOLN: 50.0000 mg | Freq: Three times a day (TID) | INTRAMUSCULAR | Status: DC | PRN

## 2024-05-28 MED ORDER — ALUM & MAG HYDROXIDE-SIMETH 200-200-20 MG/5ML PO SUSP: 30.0000 mL | ORAL | Status: DC | PRN

## 2024-05-28 MED ORDER — DIPHENHYDRAMINE HCL 25 MG PO CAPS: 50.0000 mg | ORAL_CAPSULE | Freq: Three times a day (TID) | ORAL | Status: DC | PRN

## 2024-05-28 MED ORDER — NICOTINE 21 MG/24HR TD PT24
21.0000 mg | MEDICATED_PATCH | Freq: Every day | TRANSDERMAL | Status: DC
  Administered 2024-05-29 – 2024-06-01 (×4): 21 mg via TRANSDERMAL
  Filled 2024-05-28 (×4): qty 1

## 2024-05-28 MED ORDER — MELATONIN 3 MG PO TABS
3.0000 mg | ORAL_TABLET | Freq: Every day | ORAL | Status: DC
Start: 2024-05-28 — End: 2024-06-01
  Administered 2024-05-28 – 2024-05-31 (×4): 3 mg via ORAL
  Filled 2024-05-28 (×4): qty 1

## 2024-05-28 MED ORDER — HALOPERIDOL 5 MG PO TABS
5.0000 mg | ORAL_TABLET | Freq: Three times a day (TID) | ORAL | Status: DC | PRN
  Administered 2024-05-29: 5 mg via ORAL
  Filled 2024-05-28: qty 1

## 2024-05-28 MED ORDER — LORAZEPAM 2 MG/ML IJ SOLN: 2.0000 mg | Freq: Three times a day (TID) | INTRAMUSCULAR | Status: DC | PRN

## 2024-05-28 MED ORDER — MAGNESIUM HYDROXIDE 400 MG/5ML PO SUSP: 30.0000 mL | Freq: Every day | ORAL | Status: DC | PRN

## 2024-05-28 MED ORDER — LAMOTRIGINE 25 MG PO TABS
25.0000 mg | ORAL_TABLET | Freq: Every day | ORAL | Status: DC
  Administered 2024-05-29 – 2024-06-01 (×4): 25 mg via ORAL
  Filled 2024-05-28 (×4): qty 1

## 2024-05-28 MED ORDER — FLUOXETINE HCL 20 MG PO CAPS
40.0000 mg | ORAL_CAPSULE | Freq: Every morning | ORAL | Status: DC
  Administered 2024-05-29 – 2024-06-01 (×4): 40 mg via ORAL
  Filled 2024-05-28 (×4): qty 2

## 2024-05-28 MED ORDER — ENSURE PLUS HIGH PROTEIN PO LIQD
237.0000 mL | Freq: Two times a day (BID) | ORAL | Status: DC
  Filled 2024-05-28 (×3): qty 237

## 2024-05-28 MED ORDER — HYDROXYZINE HCL 25 MG PO TABS
25.0000 mg | ORAL_TABLET | ORAL | Status: AC
  Administered 2024-05-28: 25 mg via ORAL
  Filled 2024-05-28: qty 1

## 2024-05-28 MED ORDER — GUANFACINE HCL ER 2 MG PO TB24
4.0000 mg | ORAL_TABLET | Freq: Every day | ORAL | Status: DC
  Administered 2024-05-29 – 2024-06-01 (×4): 4 mg via ORAL
  Filled 2024-05-28 (×4): qty 2

## 2024-05-28 MED ORDER — ACETAMINOPHEN 325 MG PO TABS
650.0000 mg | ORAL_TABLET | Freq: Four times a day (QID) | ORAL | Status: DC | PRN
  Administered 2024-06-01: 650 mg via ORAL
  Filled 2024-05-28 (×2): qty 2

## 2024-05-28 MED ORDER — ATOMOXETINE HCL 25 MG PO CAPS
25.0000 mg | ORAL_CAPSULE | Freq: Every day | ORAL | Status: DC
  Administered 2024-05-29 – 2024-06-01 (×4): 25 mg via ORAL
  Filled 2024-05-28 (×4): qty 1

## 2024-05-28 NOTE — ED Notes (Signed)
 Pt presented to [facility] voluntarily and admitted to continuous assessment unit w/ c/o suicidal. Hx includes, MDD, PTSD, Bipolar, GAD, substance abuse. Pt calm and cooperative and reports she is too tired to have thoughts of harming herself right now. Denies SI/HI/AVH. Pt in NAD at this time. Encouragement and support given. Will continue to monitor.

## 2024-05-28 NOTE — Progress Notes (Signed)
   05/28/24 2130  Psych Admission Type (Psych Patients Only)  Admission Status Voluntary  Psychosocial Assessment  Patient Complaints Sadness;Depression;Self-harm thoughts  Eye Contact Fair  Facial Expression Flat;Sullen  Affect Depressed;Preoccupied;Sullen  Speech Logical/coherent  Interaction Assertive  Motor Activity Other (Comment) (Appropriate)  Appearance/Hygiene In scrubs  Behavior Characteristics Appropriate to situation;Calm  Mood Despair  Thought Process  Coherency WDL  Content WDL  Delusions WDL  Perception WDL  Hallucination None reported or observed  Judgment WDL  Confusion WDL  Danger to Self  Current suicidal ideation?  (Denies)  Agreement Not to Harm Self Yes  Description of Agreement Notify Staff  Danger to Others  Danger to Others None reported or observed
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
## 2024-05-28 NOTE — ED Notes (Signed)
 Report given to Amia, RN @[facility] .

## 2024-05-28 NOTE — Plan of Care (Signed)
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
## 2024-05-28 NOTE — Discharge Instructions (Addendum)
 Admit to adult unit.
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
## 2024-05-28 NOTE — ED Notes (Signed)
 Notified Safe Transport for transfer to [facility]

## 2024-05-28 NOTE — ED Notes (Signed)
 Pt in bed w/eyes closed resting quietly. Respirations equal and unlabored. No signs or symptoms of distress. Routine safety checks maintained/ongoing.
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
## 2024-05-28 NOTE — Progress Notes (Addendum)
 Pt has been accepted to [facility] on 05/28/2024 . Bed assignment: 304-1   Pt meets inpatient criteria per Eldon Greenland, NP   Attending Physician will be Dr. Zouev  Report can be called to: Adult unit: [phone]  Pt can arrive tonight   Care Team Notified: [facility] Kathryn Parish, RN, Brittney Ward, RN, Eldon Greenland, NP

## 2024-05-29 ENCOUNTER — Encounter (HOSPITAL_COMMUNITY): Payer: Self-pay

## 2024-05-29 MED ORDER — ENSURE PLUS HIGH PROTEIN PO LIQD
237.0000 mL | Freq: Two times a day (BID) | ORAL | Status: DC
  Administered 2024-05-30 – 2024-06-01 (×2): 237 mL via ORAL
  Filled 2024-05-29 (×7): qty 237

## 2024-05-29 MED ORDER — NICOTINE POLACRILEX 2 MG MT GUM: 2.0000 mg | CHEWING_GUM | OROMUCOSAL | Status: DC | PRN

## 2024-05-29 NOTE — Plan of Care (Signed)
  Problem: Education: Goal: Knowledge of [facility] Education information/materials will improve Outcome: Progressing Goal: Emotional status will improve Outcome: Progressing Goal: Mental status will improve Outcome: Progressing Goal: Verbalization of understanding the information provided will improve Outcome: Progressing   Problem: Activity: Goal: Interest or engagement in activities will improve Outcome: Progressing   Problem: Coping: Goal: Ability to verbalize frustrations and anger appropriately will improve Outcome: Progressing Goal: Ability to demonstrate self-control will improve Outcome: Progressing   Problem: Physical Regulation: Goal: Ability to maintain clinical measurements within normal limits will improve Outcome: Progressing   Problem: Safety: Goal: Periods of time without injury will increase Outcome: Progressing

## 2024-05-29 NOTE — Group Note (Signed)
 Recreation Therapy Group Note   Group Topic:Other  Group Date: 05/29/2024 Start Time: 1412 End Time: 1457 Facilitators: Bless Lisenby-McCall, LRT,CTRS Location: [address] Hall Dayroom   Activity Description/Intervention: Therapeutic Drumming. Patients with peers and staff were given the opportunity to engage in a leader facilitated [facility] Group Empowerment Drumming Circle with staff from the [employer], in partnership with [employer]. Teaching laboratory technician and trained [employer], Kathlyne Parchment leading with LRT observing and documenting intervention and pt response. This evidenced-based practice targets 7 areas of health and wellbeing in the human experience including: stress-reduction, exercise, self-expression, camaraderie/support, nurturing, spirituality, and music-making (leisure).   Goal Area(s) Addresses:  Patient will engage in pro-social way in music group.  Patient will follow directions of drum leader on the first prompt. Patient will demonstrate no behavioral issues during group.  Patient will identify if a reduction in stress level occurs as a result of participation in therapeutic drum circle.    Education: Leisure exposure, Pharmacologist, Musical expression, Discharge Planning   Affect/Mood: N/A   Participation Level: Did not attend    Clinical Observations/Individualized Feedback:     Plan: Continue to engage patient in RT group sessions 2-3x/week.   Debbie Long, LRT,CTRS 05/29/2024 3:48 PM

## 2024-05-29 NOTE — BH IP Treatment Plan (Signed)
 Interdisciplinary Treatment and Diagnostic Plan Update  05/29/2024 Time of Session: 1040 Debbie Long MRN: [id number]  Principal Diagnosis: MDD (major depressive disorder), recurrent severe, without psychosis (HCC)  Secondary Diagnoses: Principal Problem:   MDD (major depressive disorder), recurrent severe, without psychosis (HCC) Active Problems:   DMDD (disruptive mood dysregulation disorder) (HCC)   ADHD (attention deficit hyperactivity disorder), combined type   Borderline personality disorder (HCC)   Bipolar I disorder, most recent episode depressed (HCC)   Current Medications:  Current Facility-Administered Medications  Medication Dose Route Frequency Provider Last Rate Last Admin   acetaminophen  (TYLENOL ) tablet 650 mg  650 mg Oral Q6H PRN Olasunkanmi, Oluwatosin, NP       alum & mag hydroxide-simeth (MAALOX/MYLANTA) 200-200-20 MG/5ML suspension 30 mL  30 mL Oral Q4H PRN Olasunkanmi, Oluwatosin, NP       atomoxetine  (STRATTERA ) capsule 25 mg  25 mg Oral Daily Olasunkanmi, Oluwatosin, NP   25 mg at 05/29/24 1048   haloperidol  (HALDOL ) tablet 5 mg  5 mg Oral TID PRN Olasunkanmi, Oluwatosin, NP       And   diphenhydrAMINE  (BENADRYL ) capsule 50 mg  50 mg Oral TID PRN Olasunkanmi, Oluwatosin, NP       FLUoxetine  (PROZAC ) capsule 40 mg  40 mg Oral q AM Olasunkanmi, Oluwatosin, NP   40 mg at 05/29/24 8295   guanFACINE  (INTUNIV ) ER tablet 4 mg  4 mg Oral Daily Olasunkanmi, Oluwatosin, NP   4 mg at 05/29/24 1048   lamoTRIgine (LAMICTAL) tablet 25 mg  25 mg Oral Daily Olasunkanmi, Oluwatosin, NP   25 mg at 05/29/24 1048   magnesium  hydroxide (MILK OF MAGNESIA) suspension 30 mL  30 mL Oral Daily PRN Olasunkanmi, Oluwatosin, NP       melatonin tablet 3 mg  3 mg Oral QHS Dorthea Gauze, NP   3 mg at 05/28/24 2250   nicotine  (NICODERM CQ  - dosed in mg/24 hours) patch 21 mg  21 mg Transdermal Q0600 Olasunkanmi, Oluwatosin, NP   21 mg at 05/29/24 6213   nicotine  polacrilex (NICORETTE ) gum 2 mg   2 mg Oral PRN Nwoko, Agnes I, NP       PTA Medications: Medications Prior to Admission  Medication Sig Dispense Refill Last Dose/Taking   albuterol  (PROVENTIL  HFA;VENTOLIN  HFA) 108 (90 Base) MCG/ACT inhaler Inhale 2 puffs into the lungs every 4 (four) hours as needed for wheezing or shortness of breath.      cariprazine  (VRAYLAR ) 3 MG capsule Take 1 capsule (3 mg total) by mouth daily. (Patient not taking: Reported on 05/29/2024) 30 capsule 0 Not Taking   FLUoxetine  (PROZAC ) 20 MG capsule Take 3 capsules (60 mg total) by mouth daily. (Patient not taking: Reported on 05/29/2024) 90 capsule 0 Not Taking   guanFACINE  (INTUNIV ) 4 MG TB24 ER tablet Take 1 tablet (4 mg total) by mouth at bedtime. (Patient not taking: Reported on 05/29/2024) 30 tablet 0 Not Taking   nicotine  polacrilex (NICORETTE ) 2 MG gum Take 1 each (2 mg total) by mouth as needed for smoking cessation. (Patient not taking: Reported on 05/29/2024) 100 tablet 0 Not Taking    Patient Stressors:    Patient Strengths:    Treatment Modalities: Medication Management, Group therapy, Case management,  1 to 1 session with clinician, Psychoeducation, Recreational therapy.   Physician Treatment Plan for Primary Diagnosis: MDD (major depressive disorder), recurrent severe, without psychosis (HCC) Long Term Goal(s): Improvement in symptoms so as ready for discharge   Short Term Goals: Ability to identify  and develop effective coping behaviors will improve Ability to maintain clinical measurements within normal limits will improve Compliance with prescribed medications will improve Ability to identify triggers associated with substance abuse/mental health issues will improve Ability to identify changes in lifestyle to reduce recurrence of condition will improve Ability to verbalize feelings will improve Ability to disclose and discuss suicidal ideas Ability to demonstrate self-control will improve  Medication Management: Evaluate patient's  response, side effects, and tolerance of medication regimen.  Therapeutic Interventions: 1 to 1 sessions, Unit Group sessions and Medication administration.  Evaluation of Outcomes: Not Progressing  Physician Treatment Plan for Secondary Diagnosis: Principal Problem:   MDD (major depressive disorder), recurrent severe, without psychosis (HCC) Active Problems:   DMDD (disruptive mood dysregulation disorder) (HCC)   ADHD (attention deficit hyperactivity disorder), combined type   Borderline personality disorder (HCC)   Bipolar I disorder, most recent episode depressed (HCC)  Long Term Goal(s): Improvement in symptoms so as ready for discharge   Short Term Goals: Ability to identify and develop effective coping behaviors will improve Ability to maintain clinical measurements within normal limits will improve Compliance with prescribed medications will improve Ability to identify triggers associated with substance abuse/mental health issues will improve Ability to identify changes in lifestyle to reduce recurrence of condition will improve Ability to verbalize feelings will improve Ability to disclose and discuss suicidal ideas Ability to demonstrate self-control will improve     Medication Management: Evaluate patient's response, side effects, and tolerance of medication regimen.  Therapeutic Interventions: 1 to 1 sessions, Unit Group sessions and Medication administration.  Evaluation of Outcomes: Not Progressing   RN Treatment Plan for Primary Diagnosis: MDD (major depressive disorder), recurrent severe, without psychosis (HCC) Long Term Goal(s): Knowledge of disease and therapeutic regimen to maintain health will improve  Short Term Goals: Ability to remain free from injury will improve, Ability to verbalize frustration and anger appropriately will improve, Ability to demonstrate self-control, Ability to participate in decision making will improve, Ability to verbalize feelings  will improve, Ability to disclose and discuss suicidal ideas, Ability to identify and develop effective coping behaviors will improve, and Compliance with prescribed medications will improve  Medication Management: RN will administer medications as ordered by provider, will assess and evaluate patient's response and provide education to patient for prescribed medication. RN will report any adverse and/or side effects to prescribing provider.  Therapeutic Interventions: 1 on 1 counseling sessions, Psychoeducation, Medication administration, Evaluate responses to treatment, Monitor vital signs and CBGs as ordered, Perform/monitor CIWA, COWS, AIMS and Fall Risk screenings as ordered, Perform wound care treatments as ordered.  Evaluation of Outcomes: Not Progressing   LCSW Treatment Plan for Primary Diagnosis: MDD (major depressive disorder), recurrent severe, without psychosis (HCC) Long Term Goal(s): Safe transition to appropriate next level of care at discharge, Engage patient in therapeutic group addressing interpersonal concerns.  Short Term Goals: Engage patient in aftercare planning with referrals and resources, Increase social support, Increase ability to appropriately verbalize feelings, Increase emotional regulation, Facilitate acceptance of mental health diagnosis and concerns, Facilitate patient progression through stages of change regarding substance use diagnoses and concerns, Identify triggers associated with mental health/substance abuse issues, and Increase skills for wellness and recovery  Therapeutic Interventions: Assess for all discharge needs, 1 to 1 time with Social worker, Explore available resources and support systems, Assess for adequacy in community support network, Educate family and significant other(s) on suicide prevention, Complete Psychosocial Assessment, Interpersonal group therapy.  Evaluation of Outcomes: Not Progressing  Progress in Treatment: Attending groups:  No. Participating in groups: No. Taking medication as prescribed: Yes. Toleration medication: Yes. Family/Significant other contact made: No, will contact:   Karlynn Oyster (grandmother) [phone] Patient understands diagnosis: Yes. Discussing patient identified problems/goals with staff: Yes. Medical problems stabilized or resolved: Yes. Denies suicidal/homicidal ideation: Yes. Issues/concerns per patient self-inventory: No.  New problem(s) identified: No, Describe:  none  New Short Term/Long Term Goal(s): medication stabilization, elimination of SI thoughts, development of comprehensive mental wellness plan.    Patient Goals:  Work on substance use, coping skills for drinking and smoking, and for safety of myself  Discharge Plan or Barriers: Patient recently admitted. CSW will continue to follow and assess for appropriate referrals and possible discharge planning.    Reason for Continuation of Hospitalization: Anxiety Depression Medication stabilization Suicidal ideation  Estimated Length of Stay: 5-7 days  Last 3 [place] Suicide Severity Risk Score: Flowsheet Row [facility] (Current[facility] Most recent reading at 05/28/2024  9:30 PM ED from 05/28/2024 in [facility] Most recent reading at 05/28/2024  7:45 PM UC from 03/10/2024 in [facility] [place] Most recent reading at 03/10/2024  3:30 PM  C-SSRS RISK CATEGORY Low Risk Moderate Risk No Risk    Last PHQ 2/9 Scores:    03/19/2024    1:38 PM  Depression screen PHQ 2/9  Decreased Interest 2  Down, Depressed, Hopeless 2  PHQ - 2 Score 4  Altered sleeping 2  Tired, decreased energy 2  Change in appetite 2  Feeling bad or failure about yourself  2  Trouble concentrating 2  Moving slowly or fidgety/restless 2  Suicidal thoughts 0  PHQ-9 Score 16  Difficult doing work/chores Very difficult    Scribe for Treatment  Team: Vonzell Guerin, LCSWA 05/29/2024 1:30 PM
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
## 2024-05-29 NOTE — Progress Notes (Incomplete)
 18 year old Caucasian female who, graduated from high school, unemployed, lives with her grandparents.  Background history of MDD recurrent and borderline personality disorder.  Referred by her therapist and mobile crisis team to [name].  Reported increasing suicidal thoughts for over 2 weeks.  Prescribed medication but has not been fully adherent with her medication regimen.  Reports use of THC and related products via vape.  Her goal is to learn coping skills for everything.  She is also interested in IOP. Not pervasively depressed.  No overt manic symptoms.  No overt psychotic symptoms.  No current dangerousness.  We will maintain her home medication regimen and get collateral from her grandmother.  Goal will be for a brief crisis admission.

## 2024-05-29 NOTE — Progress Notes (Signed)
   05/29/24 0900  Psych Admission Type (Psych Patients Only)  Admission Status Voluntary  Psychosocial Assessment  Patient Complaints Depression  Eye Contact Fair  Facial Expression Flat  Affect Depressed  Speech Logical/coherent  Interaction Assertive  Motor Activity Other (Comment) (WNL)  Appearance/Hygiene In scrubs  Behavior Characteristics Appropriate to situation  Mood Depressed  Thought Process  Coherency WDL  Content WDL  Delusions None reported or observed  Perception WDL  Hallucination None reported or observed  Judgment Poor  Confusion None  Danger to Self  Current suicidal ideation? Denies  Agreement Not to Harm Self Yes  Description of Agreement verbal  Danger to Others  Danger to Others None reported or observed

## 2024-05-29 NOTE — Group Note (Signed)
 Date:  05/29/2024 Time:  9:29 AM  Group Topic/Focus:  Goals Group:   The focus of this group is to help patients establish daily goals to achieve during treatment and discuss how the patient can incorporate goal setting into their daily lives to aide in recovery.    Participation Level:  Did Not Attend  Participation Quality:    Affect:    Cognitive:    Insight:   Engagement in Group:    Modes of Intervention:    Additional Comments:  Pt were aware of group time. Pt refused to attend group.  Tita Form 05/29/2024, 9:29 AM

## 2024-05-29 NOTE — Plan of Care (Signed)
  Problem: Education: Goal: Knowledge of [facility] Education information/materials will improve Outcome: Progressing Goal: Verbalization of understanding the information provided will improve Outcome: Progressing   Problem: Activity: Goal: Interest or engagement in activities will improve Outcome: Progressing   Problem: Coping: Goal: Ability to verbalize frustrations and anger appropriately will improve Outcome: Progressing Goal: Ability to demonstrate self-control will improve Outcome: Progressing

## 2024-05-29 NOTE — Progress Notes (Signed)
   05/29/24 2040  Psych Admission Type (Psych Patients Only)  Admission Status Voluntary  Psychosocial Assessment  Patient Complaints Anxiety;Nervousness  Eye Contact Avoids  Facial Expression Flat  Affect Anxious  Speech Soft  Interaction Assertive  Motor Activity Other (Comment) (WNL)  Appearance/Hygiene In scrubs  Behavior Characteristics Anxious  Mood Anxious  Thought Process  Coherency WDL  Content WDL  Delusions None reported or observed  Perception WDL  Hallucination None reported or observed  Judgment Limited  Confusion None  Danger to Self  Current suicidal ideation?  (Denies)  Agreement Not to Harm Self Yes  Description of Agreement Notify Staff.  Danger to Others  Danger to Others None reported or observed   Pt A/O x4 with noted Passive/SI and MDD. Pt observed by staff picking scab of abrasion to LLE. Pt educated the risk that can occur. Pt acknowledge understanding. No noted distress. Monitoring continues during 7p-7a.
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
## 2024-05-29 NOTE — [facility] Group Notes (Signed)
 [facility] Group Notes:  (Nursing/MHT/Case Management/Adjunct)  Date:  05/29/2024  Time:  9:41 PM  Type of Therapy:  Psychoeducational Skills  Participation Level:  None  Participation Quality:  Resistant  Affect:  Depressed and Flat  Cognitive:  Lacking  Insight:  None  Engagement in Group:  None  Modes of Intervention:  Education  Summary of Progress/Problems: The patient attended the evening speaker's meeting but just sat in her chair and picked at the scar on her leg.   Debbie Long S 05/29/2024, 9:41 PM
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
## 2024-05-30 LAB — T4, FREE: Free T4: 1.06 ng/dL (ref 0.61–1.12)

## 2024-05-30 NOTE — Plan of Care (Signed)
  Problem: Education: Goal: Knowledge of [facility] Education information/materials will improve Outcome: Progressing Goal: Verbalization of understanding the information provided will improve Outcome: Progressing   Problem: Activity: Goal: Interest or engagement in activities will improve Outcome: Progressing   Problem: Coping: Goal: Ability to verbalize frustrations and anger appropriately will improve Outcome: Progressing

## 2024-05-30 NOTE — Progress Notes (Signed)
   05/30/24 2115  Psych Admission Type (Psych Patients Only)  Admission Status Voluntary  Psychosocial Assessment  Patient Complaints Anxiety  Eye Contact Fair  Facial Expression Flat  Affect Anxious  Speech Soft  Interaction Assertive  Motor Activity Other (Comment) (WDL)  Appearance/Hygiene In scrubs  Behavior Characteristics Cooperative  Mood Anxious  Thought Process  Coherency WDL  Content WDL  Delusions None reported or observed  Perception WDL  Hallucination None reported or observed  Judgment Limited  Confusion None  Danger to Self  Current suicidal ideation? Denies  Danger to Others  Danger to Others None reported or observed

## 2024-05-30 NOTE — Plan of Care (Signed)
   Problem: Education: Goal: Emotional status will improve Outcome: Progressing Goal: Mental status will improve Outcome: Progressing

## 2024-05-30 NOTE — [facility] Group Notes (Signed)
 Adult Psychoeducational Group Note  Date:  05/30/2024 Time:  11:23 AM  Group Topic/Focus:  Goals Group:   The focus of this group is to help patients establish daily goals to achieve during treatment and discuss how the patient can incorporate goal setting into their daily lives to aide in recovery.  Participation Level:  Did Not Attend  Participation Quality:  na  Affect:  na  Cognitive:  na  Insight: na  Engagement in Group:  na  Modes of Intervention:  na  Additional Comments:  Pt did not attend group  Sabreen Kitchen 05/30/2024, 11:23 AM

## 2024-05-30 NOTE — Progress Notes (Incomplete)
 Patient is tolerating her medications well.  She has been appropriate on the unit.  No challenging behavior.  No observed response to internal stimuli.  She is interacting well with the milieu.  No overt mood symptoms.  No dangerousness.  We will continue her medication at the same dose and get collateral from her family.

## 2024-05-30 NOTE — Group Note (Signed)
 LCSW Group Therapy Note   Group Date: 05/30/2024 Start Time: 1100 End Time: 1200   Participation:  patient was present.  She listened and was respectful but didn't participate in the discussion.  Type of Therapy:  Group Therapy   Topic:  Healing From Within: Understanding Our Past, Building Our Future"  Objective:  To help participants understand the impact of early experiences on mental and physical health, with a focus on Adverse Childhood Experiences (ACEs), and to explore ways to build resilience and healing.  Group Goals: Understand ACEs and Their Impact: Learn how childhood experiences shape mental and physical health. Build Resilience: Develop strategies for overcoming challenges and creating positive change. Promote Healing: Recognize the value of support and the possibility of healing through therapy and self-care.  Summary: In today's session, we discussed how early experiences, especially ACEs, impact mental and physical health. We explored the effects of stress, abuse, and neglect on brain development and well-being. The group focused on resilience, understanding that healing and positive change are possible with support and self-awareness.  Therapeutic Modalities Used: Psychoeducation: Sharing information about ACEs and their effects. Cognitive Behavioral Therapy (CBT): Helping reframe negative thought patterns. Trauma-Informed Therapy: Creating a safe, supportive space for healing.   Heloise Gordan O Darothy Courtright, LCSWA 05/30/2024  12:40 PM

## 2024-05-30 NOTE — Progress Notes (Signed)
 [facility] MD Progress Note  05/30/2024 9:46 AM Debbie Long  MRN:  [id number]  Reason for admission: 18 year old Caucasian female with hx of major depressive disorder, bipolar disorder, ADHD & borderline disorder. Patient has been admitted/treated x several times in this [facility] at the adolescent's youth during her younger years & at the [facility] in February of 2025. Patient has hx of self-mutilating behaviors & multiple suicide attempts by overdose on medications & had tried strangulation on herself. Prior to this admission, she was receiving mental health treatments/medication management on an outpatient basis. Debbie Long is being admitted to the [facility] this time around with complaint of worsening suicidal ideations without any plans or intent. Chart review reports indicated that patient was having her counseling sessions with her therapist yesterday when she disclosed to her therapist that she has been feeling suicidal. The therapist instructed her to call the crisis hotline & she was taken to [facility] for psychiatric evaluation.    Daily notes: Patient is seen today. Chart reviewed. The chart findings discussed with the treatment team during team meeting this afternoon. Debbie Long presents alert, oriented & aware of situation. She presents with an improving affect, good eye contact. She reports, My mood is okay. My depression symptoms are coming down. I'm having problems attending group sessions because no one informs me when groups are on. I slept well last night. I'm taking my medicines, no side effects to reports. The staff reports that Debbie Long only gets out of her room during meal or snack times. She spend most of her time in her room sleeping. The staff are instructed to try to announce group times when it is about to start. Debbie Long currently denies any SIHI, AVH, delusional thoughts or paranoia. She does not appear to be responding to any internal stimuli. Her vital signs remain stable. Reviewed current lab results, Her T4 is within  norm. The T3 result is pending. Continue current plan of care as already in progress.  Principal Problem: MDD (major depressive disorder), recurrent severe, without psychosis (HCC)  Diagnosis: Principal Problem:   MDD (major depressive disorder), recurrent severe, without psychosis (HCC) Active Problems:   DMDD (disruptive mood dysregulation disorder) (HCC)   ADHD (attention deficit hyperactivity disorder), combined type   Borderline personality disorder (HCC)   Bipolar I disorder, most recent episode depressed (HCC)  Total Time spent with patient: 45 minutes  Past Psychiatric History: See H&P.  Past Medical History:  Past Medical History:  Diagnosis Date   Anxiety    Asthma    Depression    Eating disorder    Migraine     Past Surgical History:  Procedure Laterality Date   TOE SURGERY     Family History: History reviewed. No pertinent family history.  Family Psychiatric  History: See H&P.  Social History:  Social History   Substance and Sexual Activity  Alcohol Use Yes   Comment: socially     Social History   Substance and Sexual Activity  Drug Use Yes   Types: Marijuana    Social History   Socioeconomic History   Marital status: Single    Spouse name: Not on file   Number of children: Not on file   Years of education: Not on file   Highest education level: Not on file  Occupational History   Not on file  Tobacco Use   Smoking status: Some Days    Types: Cigarettes   Smokeless tobacco: Never  Vaping Use   Vaping status: Former  Substance and Sexual Activity   Alcohol use: Yes    Comment: socially   Drug use: Yes    Types: Marijuana   Sexual activity: Yes  Other Topics Concern   Not on file  Social History Narrative   Not on file   Social Drivers of Health   Financial Resource Strain: Low Risk  (08/25/2023)   Received from [facility]   Overall Financial Resource Strain (CARDIA)    Difficulty of Paying Living Expenses: Not hard at all   Food Insecurity: No Food Insecurity (05/28/2024)   Hunger Vital Sign    Worried About Running Out of Food in the Last Year: Never true    Ran Out of Food in the Last Year: Never true  Transportation Needs: No Transportation Needs (05/28/2024)   PRAPARE - Administrator, Civil Service (Medical): No    Lack of Transportation (Non-Medical): No  Physical Activity: Not on file  Stress: Not on file  Social Connections: Unknown (04/25/2022)   Received [facility]   Social Network    Social Network: Not on file   Additional Social History:   Sleep: Good Estimated Sleeping Duration (Last 24 Hours): 7.00-7.25 hours  Appetite:  Good  Current Medications: Current Facility-Administered Medications  Medication Dose Route Frequency Provider Last Rate Last Admin   acetaminophen  (TYLENOL ) tablet 650 mg  650 mg Oral Q6H PRN Olasunkanmi, Oluwatosin, NP       alum & mag hydroxide-simeth (MAALOX/MYLANTA) 200-200-20 MG/5ML suspension 30 mL  30 mL Oral Q4H PRN Olasunkanmi, Oluwatosin, NP       atomoxetine  (STRATTERA ) capsule 25 mg  25 mg Oral Daily Olasunkanmi, Oluwatosin, NP   25 mg at 05/30/24 0900   haloperidol  (HALDOL ) tablet 5 mg  5 mg Oral TID PRN Olasunkanmi, Oluwatosin, NP   5 mg at 05/29/24 2129   And   diphenhydrAMINE  (BENADRYL ) capsule 50 mg  50 mg Oral TID PRN Olasunkanmi, Oluwatosin, NP       feeding supplement (ENSURE PLUS HIGH PROTEIN) liquid 237 mL  237 mL Oral BID BM Dorthea Gauze, NP       FLUoxetine  (PROZAC ) capsule 40 mg  40 mg Oral q AM Olasunkanmi, Oluwatosin, NP   40 mg at 05/30/24 0800   guanFACINE  (INTUNIV ) ER tablet 4 mg  4 mg Oral Daily Olasunkanmi, Oluwatosin, NP   4 mg at 05/30/24 0900   lamoTRIgine (LAMICTAL) tablet 25 mg  25 mg Oral Daily Olasunkanmi, Oluwatosin, NP   25 mg at 05/30/24 0900   magnesium  hydroxide (MILK OF MAGNESIA) suspension 30 mL  30 mL Oral Daily PRN Olasunkanmi, Oluwatosin, NP       melatonin tablet 3 mg  3 mg Oral QHS Dorthea Gauze,  NP   3 mg at 05/29/24 2128   nicotine  (NICODERM CQ  - dosed in mg/24 hours) patch 21 mg  21 mg Transdermal Q0600 Olasunkanmi, Oluwatosin, NP   21 mg at 05/30/24 4098   nicotine  polacrilex (NICORETTE ) gum 2 mg  2 mg Oral PRN Sachiko Methot I, NP       Lab Results:  Results for orders placed or performed during the hospital encounter of 05/28/24 (from the past 48 hours)  CBC with Differential/Platelet     Status: Abnormal   Collection Time: 05/28/24  5:31 PM  Result Value Ref Range   WBC 10.9 (H) 4.0 - 10.5 K/uL   RBC 5.33 (H) 3.87 - 5.11 MIL/uL   Hemoglobin 14.8 12.0 - 15.0 g/dL   HCT  45.4 36.0 - 46.0 %   MCV 85.2 80.0 - 100.0 fL   MCH 27.8 26.0 - 34.0 pg   MCHC 32.6 30.0 - 36.0 g/dL   RDW 69.6 29.5 - 28.4 %   Platelets 413 (H) 150 - 400 K/uL   nRBC 0.0 0.0 - 0.2 %   Neutrophils Relative % 75 %   Neutro Abs 8.3 (H) 1.7 - 7.7 K/uL   Lymphocytes Relative 18 %   Lymphs Abs 1.9 0.7 - 4.0 K/uL   Monocytes Relative 5 %   Monocytes Absolute 0.5 0.1 - 1.0 K/uL   Eosinophils Relative 1 %   Eosinophils Absolute 0.1 0.0 - 0.5 K/uL   Basophils Relative 1 %   Basophils Absolute 0.1 0.0 - 0.1 K/uL   Immature Granulocytes 0 %   Abs Immature Granulocytes 0.03 0.00 - 0.07 K/uL    Comment: Performed at [facility], [address], [place], [place] [address]  Comprehensive metabolic panel     Status: None   Collection Time: 05/28/24  5:31 PM  Result Value Ref Range   Sodium 140 135 - 145 mmol/L   Potassium 4.2 3.5 - 5.1 mmol/L   Chloride 106 98 - 111 mmol/L   CO2 25 22 - 32 mmol/L   Glucose, Bld 96 70 - 99 mg/dL    Comment: Glucose reference range applies only to samples taken after fasting for at least 8 hours.   BUN 8 6 - 20 mg/dL   Creatinine, Ser 0.10 0.44 - 1.00 mg/dL   Calcium 9.4 8.9 - 27.2 mg/dL   Total Protein 7.0 6.5 - 8.1 g/dL   Albumin 4.2 3.5 - 5.0 g/dL   AST 19 15 - 41 U/L   ALT 18 0 - 44 U/L   Alkaline Phosphatase 77 38 - 126 U/L   Total Bilirubin 0.5 0.0 - 1.2 mg/dL    GFR, Estimated >53 >66 mL/min    Comment: (NOTE) Calculated using the CKD-EPI Creatinine Equation (2021)    Anion gap 9 5 - 15    Comment: Performed at [facility], [address] [place], [place] [address]  Hemoglobin A1c     Status: None   Collection Time: 05/28/24  5:31 PM  Result Value Ref Range   Hgb A1c MFr Bld 5.3 4.8 - 5.6 %    Comment: (NOTE) Diagnosis of Diabetes The following HbA1c ranges recommended by the American Diabetes Association (ADA) may be used as an aid in the diagnosis of diabetes mellitus.  Hemoglobin             Suggested A1C NGSP%              Diagnosis  <5.7                   Non Diabetic  5.7-6.4                Pre-Diabetic  >6.4                   Diabetic  <7.0                   Glycemic control for                       adults with diabetes.     Mean Plasma Glucose 105.41 mg/dL    Comment: Performed at [facility], [address] [place], [place] [address]  Ethanol     Status: None   Collection Time: 05/28/24  5:31 PM  Result Value Ref Range   Alcohol, Ethyl (B) <15 <15 mg/dL    Comment: (NOTE) For medical purposes only. Performed at [facility], [address], [place], [place] [address]   Lipid panel     Status: Abnormal   Collection Time: 05/28/24  5:31 PM  Result Value Ref Range   Cholesterol 112 0 - 169 mg/dL   Triglycerides 76 <308 mg/dL   HDL 32 (L) >65 mg/dL   Total CHOL/HDL Ratio 3.5 RATIO   VLDL 15 0 - 40 mg/dL   LDL Cholesterol 65 0 - 99 mg/dL    Comment:        Total Cholesterol/HDL:CHD Risk Coronary Heart Disease Risk Table                     Men   Women  1/2 Average Risk   3.4   3.3  Average Risk       5.0   4.4  2 X Average Risk   9.6   7.1  3 X Average Risk  23.4   11.0        Use the calculated Patient Ratio above and the CHD Risk Table to determine the patient's CHD Risk.        ATP III CLASSIFICATION (LDL):  <100     mg/dL   Optimal  784-696  mg/dL   Near or Above                     Optimal  130-159  mg/dL   Borderline  295-284  mg/dL   High  >132     mg/dL   Very High Performed at [facility], [address], [place], [place] [address]   TSH     Status: Abnormal   Collection Time: 05/28/24  5:31 PM  Result Value Ref Range   TSH 0.260 (L) 0.350 - 4.500 uIU/mL    Comment: Performed by a 3rd Generation assay with a functional sensitivity of <=0.01 uIU/mL. Performed at [facility], [address], [place], [place] [address]   POC urine preg, ED     Status: None   Collection Time: 05/28/24  5:31 PM  Result Value Ref Range   Preg Test, Ur Negative Negative  POCT Urine Drug Screen - (I-Screen)     Status: Abnormal   Collection Time: 05/28/24  5:31 PM  Result Value Ref Range   POC Amphetamine UR None Detected NONE DETECTED (Cut Off Level 1000 ng/mL)   POC Secobarbital (BAR) None Detected NONE DETECTED (Cut Off Level 300 ng/mL)   POC Buprenorphine (BUP) None Detected NONE DETECTED (Cut Off Level 10 ng/mL)   POC Oxazepam (BZO) None Detected NONE DETECTED (Cut Off Level 300 ng/mL)   POC Cocaine UR None Detected NONE DETECTED (Cut Off Level 300 ng/mL)   POC Methamphetamine UR None Detected NONE DETECTED (Cut Off Level 1000 ng/mL)   POC Morphine None Detected NONE DETECTED (Cut Off Level 300 ng/mL)   POC Methadone UR None Detected NONE DETECTED (Cut Off Level 300 ng/mL)   POC Oxycodone UR None Detected NONE DETECTED (Cut Off Level 100 ng/mL)   POC Marijuana UR Positive (A) NONE DETECTED (Cut Off Level 50 ng/mL)  VITAMIN D 25 Hydroxy (Vit-D Deficiency, Fractures)     Status: None   Collection Time: 05/28/24  5:31 PM  Result  Value Ref Range   Vit D, 25-Hydroxy 34.25 30 - 100 ng/mL    Comment: (NOTE) Vitamin D deficiency has been defined by the Institute of Medicine  and an Endocrine Society practice guideline as a level of serum 25-OH  vitamin D less than 20 ng/mL (1,2). The Endocrine Society went on to  further define vitamin D insufficiency as a  level between 21 and 29  ng/mL (2).  1. IOM (Institute of Medicine). 2010. Dietary reference intakes for  calcium and D. [place]: [employer]. 2. Holick MF, Binkley Hereford, Bischoff-Ferrari HA, et al. Evaluation,  treatment, and prevention of vitamin D deficiency: an Endocrine  Society clinical practice guideline, JCEM. 2011 Jul; 96(7): 1911-30.  Performed at [facility], [address], [place], [place] [address]    Blood Alcohol level:  Lab Results  Component Value Date   [facility] <15 05/28/2024   ETH <10 01/21/2024   Metabolic Disorder Labs: Lab Results  Component Value Date   HGBA1C 5.3 05/28/2024   MPG 105.41 05/28/2024   MPG 102.54 01/21/2024   No results found for: PROLACTIN Lab Results  Component Value Date   CHOL 112 05/28/2024   TRIG 76 05/28/2024   HDL 32 (L) 05/28/2024   CHOLHDL 3.5 05/28/2024   VLDL 15 05/28/2024   LDLCALC 65 05/28/2024   LDLCALC 88 01/21/2024   Physical Findings: AIMS:  ,  ,  ,  ,  ,  ,   CIWA:    COWS:     Musculoskeletal: Strength & Muscle Tone: within normal limits Gait & Station: normal Patient leans: N/A  Psychiatric Specialty Exam:  Presentation  General Appearance:  Casual  Eye Contact: Fair  Speech: Clear and Coherent; Normal Rate  Speech Volume: Normal  Handedness: Right   Mood and Affect  Mood: Anxious; Depressed  Affect: Congruent; Flat   Thought Process  Thought Processes: Coherent  Descriptions of Associations:Intact  Orientation:Full (Time, Place and Person)  Thought Content:Logical  History of Schizophrenia/Schizoaffective disorder:No  Duration of Psychotic Symptoms:N/A  Hallucinations:Hallucinations: None  Ideas of Reference:None  Suicidal Thoughts:Suicidal Thoughts: No  Homicidal Thoughts:Homicidal Thoughts: No   Sensorium  Memory: Immediate Good; Recent Good; Remote Good  Judgment: Fair  Insight: Fair   Producer, television/film/video: Fair  Attention Span: Fair  Recall: Good  Fund of Knowledge: Fair  Language: Good   Psychomotor Activity  Psychomotor Activity: Psychomotor Activity: Normal   Assets  Assets: Communication Skills; Desire for Improvement; Housing; Physical Health; Resilience; Social Support   Sleep  Sleep: Sleep: Fair Number of Hours of Sleep: 6    Physical Exam: Physical Exam Vitals and nursing note reviewed.  HENT:     Head: Normocephalic.   Cardiovascular:     Rate and Rhythm: Normal rate.     Pulses: Normal pulses.  Pulmonary:     Effort: Pulmonary effort is normal.  Genitourinary:    Comments: Deferred  Musculoskeletal:        General: Normal range of motion.     Cervical back: Normal range of motion.   Skin:    General: Skin is dry.     Comments: Some abrasions to front of bilateral legs.  No signs of infections or swellings noted.   Neurological:     General: No focal deficit present.     Mental Status: She is alert and oriented to person, place, and time.    Review of Systems  Constitutional:  Negative for chills, diaphoresis  and fever.  HENT:  Negative for congestion and sore throat.   Respiratory:  Negative for cough, shortness of breath and wheezing.   Cardiovascular:  Negative for chest pain and palpitations.  Gastrointestinal:  Negative for abdominal pain, constipation, diarrhea, heartburn, nausea and vomiting.  Genitourinary:  Negative for dysuria.  Musculoskeletal:  Negative for joint pain and myalgias.  Skin:        Some abrasions to front of bilateral legs.  No signs of infections or swellings noted.   Neurological:  Negative for dizziness, tingling, tremors, sensory change, speech change, focal weakness, seizures, loss of consciousness, weakness and headaches.  Endo/Heme/Allergies:        Reviewed allergy lists.  Psychiatric/Behavioral:  Positive for depression (Improving gradually). Negative for hallucinations, memory loss,  substance abuse and suicidal ideas (Hx of including attempts.). The patient is not nervous/anxious and does not have insomnia.    Blood pressure 120/83, pulse 66, temperature 97.9 F (36.6 C), temperature source Oral, resp. rate 19, height 5' 6 (1.676 m), weight 71.8 kg, SpO2 100%. Body mass index is 25.53 kg/m.  Treatment Plan Summary: Daily contact with patient to assess and evaluate symptoms and progress in treatment and Medication management.   Principal/active diagnoses.  Major depressive disorder), recurrent severe, without psychosis (HCC) Hx. DMDD (disruptive mood dysregulation disorder) (HCC) Hx.Bipolar I disorder, most recent episode depressed (HCC) Hx.ADHD (attention deficit hyperactivity disorder), combined type  Hx.Borderline personality disorder (HCC)  Plan: The risks/benefits/side-effects/alternatives to the medications in use were discussed in detail with the patient and time was given for patient's questions. The patient consents to medication trial.    -Continue Strattera  25 mg po daily for ADHD.  -Continue Guanfacine  ER 4 mg po daily for ADHD.  -Continue Fluoxetine  40 mg po daily for depression. -Continue Lamictal 25 mg po daily for mood stabilization.  -Continue Melatonin 3 mg po Q hs for insomnia.  -Continue Nicotine  patch 21 mg trans-dermally Q 24 hrs for smoking cessation.  -Continue Nicorette  gum 2 mg po daily for smoking cessation.    Labs: Will obtain T3, T4 due to TSH (H). Reviewed 06-19 result. The T4 is within norm. T3 result pending.   Agitation protocols.  -Continue as recommended.   Other PRNS -Continue Tylenol  650 mg every 6 hours PRN for mild pain -Continue Maalox 30 ml Q 4 hrs PRN for indigestion -Continue MOM 30 ml po Q 6 hrs for constipation   Safety and Monitoring: Voluntary admission to inpatient psychiatric unit for safety, stabilization and treatment Daily contact with patient to assess and evaluate symptoms and progress in  treatment Patient's case to be discussed in multi-disciplinary team meeting Observation Level : q15 minute checks Vital signs: q12 hours Precautions: Safety   Discharge Planning: Social work and case management to assist with discharge planning and identification of hospital follow-up needs prior to discharge Estimated LOS: 5-7 days Discharge Concerns: Need to establish a safety plan; Medication compliance and effectiveness Discharge Goals: Return home with outpatient referrals for mental health follow-up including medication management/psychotherapy  Asuncion Layer, NP, pmhnp, fnp-bc. 05/30/2024, 9:46 AM

## 2024-05-30 NOTE — Progress Notes (Signed)
   05/30/24 0740  Psych Admission Type (Psych Patients Only)  Admission Status Voluntary  Psychosocial Assessment  Patient Complaints Anxiety  Eye Contact Avoids  Facial Expression Flat  Affect Anxious  Speech Soft  Interaction Assertive  Motor Activity Other (Comment) (WNL)  Appearance/Hygiene In scrubs  Behavior Characteristics Anxious  Mood Anxious  Thought Process  Coherency WDL  Content WDL  Delusions None reported or observed  Perception WDL  Hallucination None reported or observed  Judgment Limited  Confusion None  Danger to Self  Current suicidal ideation? Denies  Agreement Not to Harm Self Yes  Description of Agreement verbal  Danger to Others  Danger to Others None reported or observed

## 2024-05-30 NOTE — [facility] Group Notes (Signed)
 [facility] Group Notes:  (Nursing/MHT/Case Management/Adjunct)  Date:  05/30/2024  Time:  2000  Type of Therapy:  Wrap up group  Participation Level:  Active  Participation Quality:  Appropriate  Affect:  Depressed and Resistant  Cognitive:  Alert  Insight:  Improving  Engagement in Group:  Developing/Improving  Modes of Intervention:  Clarification, Education, and Support  Summary of Progress/Problems: Positive thinking and positive change were discussed.   Metta Actis S 05/30/2024, 9:04 PM

## 2024-05-31 DIAGNOSIS — F332 Major depressive disorder, recurrent severe without psychotic features: Principal | ICD-10-CM

## 2024-05-31 LAB — T3, FREE: T3, Free: 4 pg/mL (ref 2.3–5.0)

## 2024-05-31 MED ORDER — HYDROXYZINE HCL 25 MG PO TABS
25.0000 mg | ORAL_TABLET | Freq: Three times a day (TID) | ORAL | Status: DC | PRN
  Administered 2024-05-31 (×2): 25 mg via ORAL
  Filled 2024-05-31 (×2): qty 1

## 2024-05-31 NOTE — Plan of Care (Signed)

## 2024-05-31 NOTE — Group Note (Signed)
 Recreation Therapy Group Note   Group Topic:Team Building  Group Date: 05/31/2024 Start Time: 0935 End Time: 1005 Facilitators: Kieron Kantner-McCall, LRT,CTRS Location: [address] Dayroom   Group Topic: Communication, Team Building, Problem Solving  Goal Area(s) Addresses:  Patient will effectively work with peer towards shared goal.  Patient will identify skills used to make activity successful.  Patient will identify how skills used during activity can be applied to reach post d/c goals.   Behavioral Response:   Intervention: STEM Activity- Glass blower/designer  Activity: Tallest [employer]. In teams of 5-6, patients were given 11 craft pipe cleaners. Using the materials provided, patients were instructed to compete again the opposing team(s) to build the tallest free-standing structure from floor level. The activity was timed; difficulty increased by Clinical research associate as Production designer, theatre/television/film continued.  Systematically resources were removed with additional directions for example, placing one arm behind their back, working in silence, and shape stipulations. LRT facilitated post-activity discussion reviewing team processes and necessary communication skills involved in completion. Patients were encouraged to reflect how the skills utilized, or not utilized, in this activity can be incorporated to positively impact support systems post discharge.  Education: Pharmacist, community, Scientist, physiological, Discharge Planning   Education Outcome: Acknowledges education/In group clarification offered/Needs additional education.    Affect/Mood: N/A   Participation Level: Did not attend    Clinical Observations/Individualized Feedback:     Plan: Continue to engage patient in RT group sessions 2-3x/week.   Debbie Long, LRT,CTRS 05/31/2024 12:14 PM

## 2024-05-31 NOTE — Progress Notes (Signed)
 Patient up with complaints of anxiety.  Administered PRN Hydroxyzine  per [facility] per patient request.

## 2024-05-31 NOTE — Progress Notes (Signed)
   05/31/24 2000  Psych Admission Type (Psych Patients Only)  Admission Status Voluntary  Psychosocial Assessment  Patient Complaints Anxiety  Eye Contact Fair  Facial Expression Animated  Affect Appropriate to circumstance  Speech Logical/coherent  Interaction Assertive  Motor Activity Slow  Appearance/Hygiene In scrubs  Behavior Characteristics Cooperative;Appropriate to situation  Mood Depressed  Thought Process  Coherency WDL  Content WDL  Delusions None reported or observed  Perception WDL  Hallucination None reported or observed  Judgment Limited  Confusion None  Danger to Self  Current suicidal ideation? Denies  Agreement Not to Harm Self Yes  Description of Agreement verbal  Danger to Others  Danger to Others None reported or observed

## 2024-05-31 NOTE — [facility] Suicide Risk Assessment (Signed)
 [facility] INPATIENT:  Family/Significant Other Suicide Prevention Education  Suicide Prevention Education:  Education Completed; Karlynn Oyster (grandmother) [phone],  (name of family member/significant other) has been identified by the patient as the family member/significant other with whom the patient will be residing, and identified as the person(s) who will aid the patient in the event of a mental health crisis (suicidal ideations/suicide attempt).  With written consent from the patient, the family member/significant other has been provided the following suicide prevention education, prior to the and/or following the discharge of the patient.  Tanya Fantasia confirmed patient will not have access to firearms/guns/weapons to harm herself or others after discharge. Tanya Fantasia reported she'd be willing to assist patient should she have a mental health crisis and is able to assist patient with safely securing her medications. Tanya Fantasia denied having safety concerns regarding patient's discharge.    The suicide prevention education provided includes the following: Suicide risk factors Suicide prevention and interventions National Suicide Hotline telephone number [facility] assessment telephone number [facility] Emergency Assistance 911 [facility] and/or Residential Mobile Crisis Unit telephone number  Request made of family/significant other to: Remove weapons (e.g., guns, rifles, knives), all items previously/currently identified as safety concern.   Remove drugs/medications (over-the-counter, prescriptions, illicit drugs), all items previously/currently identified as a safety concern.  The family member/significant other verbalizes understanding of the suicide prevention education information provided.  The family member/significant other agrees to remove the items of safety concern listed above.  Carnel Stegman M Destani Wamser, LCSWA 05/31/2024, 1:16 PM

## 2024-05-31 NOTE — Group Note (Unsigned)
 Date:  05/31/2024 Time:  9:22 AM  Group Topic/Focus:  Goals Group:   The focus of this group is to help patients establish daily goals to achieve during treatment and discuss how the patient can incorporate goal setting into their daily lives to aide in recovery.     Participation Level:  {[facility] PARTICIPATION DGUYQ:03474}  Participation Quality:  {[facility] PARTICIPATION QUALITY:22265}  Affect:  {[facility] AFFECT:22266}  Cognitive:  {[facility] COGNITIVE:22267}  Insight: {B[facility] Insight2:20797}  Engagement in Group:  {B[facility] ENGAGEMENT IN QVZDG:38756}  Modes of Intervention:  {[facility] MODES OF INTERVENTION:22269}  Additional Comments:  ***  Tita Form 05/31/2024, 9:22 AM

## 2024-05-31 NOTE — Progress Notes (Signed)
 [facility] MD Progress Note  05/31/2024 12:59 PM Debbie Long  MRN:  [id number]  Reason for admission: 18 year old Caucasian female with hx of major depressive disorder, bipolar disorder, ADHD & borderline disorder. Patient has been admitted/treated x several times in this [facility] at the adolescent's youth during her younger years & at the [facility] in February of 2025. Patient has hx of self-mutilating behaviors & multiple suicide attempts by overdose on medications & had tried strangulation on herself. Prior to this admission, she was receiving mental health treatments/medication management on an outpatient basis. Debbie Long is being admitted to the [facility] this time around with complaint of worsening suicidal ideations without any plans or intent. Chart review reports indicated that patient was having her counseling sessions with her therapist yesterday when she disclosed to her therapist that she has been feeling suicidal. The therapist instructed her to call the crisis hotline & she was taken to [facility] for psychiatric evaluation.    Daily notes: Debbie Long is seen in her room. Chart reviewed. She presents alert, oriented & aware of situation. She reports, I'm doing okay. I'm sleeping in this morning because I did not sleep very well last night. I did receive something for sleep, it is just that I could not fall asleep. Besides this, my roommate snores too. I'm doing very well on my medicines without any side effects. I have been to at least 3 different group sessions since being here. I have been here now 4-5 days. I think it is time we should start discussing discharge plans. Soon after this follow-up evaluation, Krisanne got out of her room to the day room to join the remaining activities that are going on in there. Debbie Long currently denies any SIHI, AVH, delusional thoughts or paranoia. She does not appear to be responding to any internal stimuli. Her vital signs remain stable. Reviewed current lab results, Her T4 is within norm. The T3  is also within norm. There are no changes made on her current plan of care. Continue as already in progress.  Principal Problem: MDD (major depressive disorder), recurrent severe, without psychosis (HCC)  Diagnosis: Principal Problem:   MDD (major depressive disorder), recurrent severe, without psychosis (HCC) Active Problems:   DMDD (disruptive mood dysregulation disorder) (HCC)   ADHD (attention deficit hyperactivity disorder), combined type   Borderline personality disorder (HCC)   Bipolar I disorder, most recent episode depressed (HCC)  Total Time spent with patient: 45 minutes  Past Psychiatric History: See H&P.  Past Medical History:  Past Medical History:  Diagnosis Date   Anxiety    Asthma    Depression    Eating disorder    Migraine     Past Surgical History:  Procedure Laterality Date   TOE SURGERY     Family History: History reviewed. No pertinent family history.  Family Psychiatric  History: See H&P.  Social History:  Social History   Substance and Sexual Activity  Alcohol Use Yes   Comment: socially     Social History   Substance and Sexual Activity  Drug Use Yes   Types: Marijuana    Social History   Socioeconomic History   Marital status: Single    Spouse name: Not on file   Number of children: Not on file   Years of education: Not on file   Highest education level: Not on file  Occupational History   Not on file  Tobacco Use   Smoking status: Some Days    Types: Cigarettes  Smokeless tobacco: Never  Vaping Use   Vaping status: Former  Substance and Sexual Activity   Alcohol use: Yes    Comment: socially   Drug use: Yes    Types: Marijuana   Sexual activity: Yes  Other Topics Concern   Not on file  Social History Narrative   Not on file   Social Drivers of Health   Financial Resource Strain: Low Risk  (08/25/2023)   [facility]   Overall Financial Resource Strain (CARDIA)    Difficulty of Paying Living  Expenses: Not hard at all  Food Insecurity: No Food Insecurity (05/28/2024)   Hunger Vital Sign    Worried About Running Out of Food in the Last Year: Never true    Ran Out of Food in the Last Year: Never true  Transportation Needs: No Transportation Needs (05/28/2024)   PRAPARE - Administrator, Civil Service (Medical): No    Lack of Transportation (Non-Medical): No  Physical Activity: Not on file  Stress: Not on file  Social Connections: Unknown (04/25/2022)   Received from [facility]   Social Network    Social Network: Not on file   Additional Social History:   Sleep: Good Estimated Sleeping Duration (Last 24 Hours): 5.00-6.00 hours  Appetite:  Good  Current Medications: Current Facility-Administered Medications  Medication Dose Route Frequency Provider Last Rate Last Admin   acetaminophen  (TYLENOL ) tablet 650 mg  650 mg Oral Q6H PRN Olasunkanmi, Oluwatosin, NP       alum & mag hydroxide-simeth (MAALOX/MYLANTA) 200-200-20 MG/5ML suspension 30 mL  30 mL Oral Q4H PRN Olasunkanmi, Oluwatosin, NP       atomoxetine  (STRATTERA ) capsule 25 mg  25 mg Oral Daily Olasunkanmi, Oluwatosin, NP   25 mg at 05/31/24 6578   haloperidol  (HALDOL ) tablet 5 mg  5 mg Oral TID PRN Olasunkanmi, Oluwatosin, NP   5 mg at 05/29/24 2129   And   diphenhydrAMINE  (BENADRYL ) capsule 50 mg  50 mg Oral TID PRN Olasunkanmi, Oluwatosin, NP       feeding supplement (ENSURE PLUS HIGH PROTEIN) liquid 237 mL  237 mL Oral BID BM Dorthea Gauze, NP   237 mL at 05/30/24 1355   FLUoxetine  (PROZAC ) capsule 40 mg  40 mg Oral q AM Olasunkanmi, Oluwatosin, NP   40 mg at 05/31/24 0645   guanFACINE  (INTUNIV ) ER tablet 4 mg  4 mg Oral Daily Olasunkanmi, Oluwatosin, NP   4 mg at 05/31/24 4696   hydrOXYzine  (ATARAX ) tablet 25 mg  25 mg Oral TID PRN Onuoha, Chinwendu V, NP   25 mg at 05/31/24 0417   lamoTRIgine (LAMICTAL) tablet 25 mg  25 mg Oral Daily Olasunkanmi, Oluwatosin, NP   25 mg at 05/31/24 2952   magnesium   hydroxide (MILK OF MAGNESIA) suspension 30 mL  30 mL Oral Daily PRN Olasunkanmi, Oluwatosin, NP       melatonin tablet 3 mg  3 mg Oral QHS Dorthea Gauze, NP   3 mg at 05/30/24 2115   nicotine  (NICODERM CQ  - dosed in mg/24 hours) patch 21 mg  21 mg Transdermal Q0600 Olasunkanmi, Oluwatosin, NP   21 mg at 05/31/24 8413   nicotine  polacrilex (NICORETTE ) gum 2 mg  2 mg Oral PRN Chrishauna Mee I, NP       Lab Results:  Results for orders placed or performed during the hospital encounter of 05/28/24 (from the past 48 hours)  T3, free     Status: None   Collection Time:  05/30/24  6:23 AM  Result Value Ref Range   T3, Free 4.0 2.3 - 5.0 pg/mL    Comment: (NOTE) Performed At: [facility] [address] [place], [place] [id number] Pearlean Botts MD XB:[id number]   T4, free     Status: None   Collection Time: 05/30/24  6:23 AM  Result Value Ref Range   Free T4 1.06 0.61 - 1.12 ng/dL    Comment: (NOTE) Biotin ingestion may interfere with free T4 tests. If the results are inconsistent with the TSH level, previous test results, or the clinical presentation, then consider biotin interference. If needed, order repeat testing after stopping biotin. Performed at [facility], [address], [place], [place] [address]    Blood Alcohol level:  Lab Results  Component Value Date   [facility] <15 05/28/2024   ETH <10 01/21/2024   Metabolic Disorder Labs: Lab Results  Component Value Date   HGBA1C 5.3 05/28/2024   MPG 105.41 05/28/2024   MPG 102.54 01/21/2024   No results found for: PROLACTIN Lab Results  Component Value Date   CHOL 112 05/28/2024   TRIG 76 05/28/2024   HDL 32 (L) 05/28/2024   CHOLHDL 3.5 05/28/2024   VLDL 15 05/28/2024   LDLCALC 65 05/28/2024   LDLCALC 88 01/21/2024   Physical Findings: AIMS:  ,  ,  ,  ,  ,  ,   CIWA:    COWS:     Musculoskeletal: Strength & Muscle Tone: within normal limits Gait & Station: normal Patient leans: N/A  Psychiatric  Specialty Exam:  Presentation  General Appearance:  Casual; Fairly Groomed  Eye Contact: Good  Speech: Clear and Coherent; Normal Rate  Speech Volume: Normal  Handedness: Right   Mood and Affect  Mood: -- (Improving)  Affect: Congruent   Thought Process  Thought Processes: Coherent; Linear  Descriptions of Associations:Intact  Orientation:Full (Time, Place and Person)  Thought Content:Logical  History of Schizophrenia/Schizoaffective disorder:No  Duration of Psychotic Symptoms:N/A  Hallucinations:Hallucinations: None   Ideas of Reference:None  Suicidal Thoughts:Suicidal Thoughts: No   Homicidal Thoughts:Homicidal Thoughts: No    Sensorium  Memory: Immediate Good; Recent Good; Remote Good  Judgment: Fair  Insight: Fair   Art therapist  Concentration: Good  Attention Span: Good  Recall: Good  Fund of Knowledge: Fair  Language: Good   Psychomotor Activity  Psychomotor Activity: Psychomotor Activity: Normal    Assets  Assets: Communication Skills; Housing; Resilience; Physical Health; Social Support   Sleep  Sleep: Sleep: Good Number of Hours of Sleep: 9     Physical Exam: Physical Exam Vitals and nursing note reviewed.  HENT:     Head: Normocephalic.   Cardiovascular:     Rate and Rhythm: Normal rate.     Pulses: Normal pulses.  Pulmonary:     Effort: Pulmonary effort is normal.  Genitourinary:    Comments: Deferred  Musculoskeletal:        General: Normal range of motion.     Cervical back: Normal range of motion.   Skin:    General: Skin is dry.     Comments: Some abrasions to front of bilateral legs.  No signs of infections or swellings noted.   Neurological:     General: No focal deficit present.     Mental Status: She is alert and oriented to person, place, and time.    Review of Systems  Constitutional:  Negative for chills, diaphoresis and fever.  HENT:  Negative for congestion  and  sore throat.   Respiratory:  Negative for cough, shortness of breath and wheezing.   Cardiovascular:  Negative for chest pain and palpitations.  Gastrointestinal:  Negative for abdominal pain, constipation, diarrhea, heartburn, nausea and vomiting.  Genitourinary:  Negative for dysuria.  Musculoskeletal:  Negative for joint pain and myalgias.  Skin:        Some abrasions to front of bilateral legs.  No signs of infections or swellings noted.   Neurological:  Negative for dizziness, tingling, tremors, sensory change, speech change, focal weakness, seizures, loss of consciousness, weakness and headaches.  Endo/Heme/Allergies:        Reviewed allergy lists.  Psychiatric/Behavioral:  Positive for depression (Improving gradually). Negative for hallucinations, memory loss, substance abuse and suicidal ideas (Hx of including attempts.). The patient is not nervous/anxious and does not have insomnia.    Blood pressure 118/72, pulse 80, temperature 98.4 F (36.9 C), temperature source Oral, resp. rate 19, height 5' 6 (1.676 m), weight 71.8 kg, SpO2 100%. Body mass index is 25.53 kg/m.  Treatment Plan Summary: Daily contact with patient to assess and evaluate symptoms and progress in treatment and Medication management.   Principal/active diagnoses.  Major depressive disorder), recurrent severe, without psychosis (HCC) Hx. DMDD (disruptive mood dysregulation disorder) (HCC) Hx.Bipolar I disorder, most recent episode depressed (HCC) Hx.ADHD (attention deficit hyperactivity disorder), combined type  Hx.Borderline personality disorder (HCC)  Plan: The risks/benefits/side-effects/alternatives to the medications in use were discussed in detail with the patient and time was given for patient's questions. The patient consents to medication trial.    -Continue Strattera  25 mg po daily for ADHD.  -Continue Guanfacine  ER 4 mg po daily for ADHD.  -Continue Fluoxetine  40 mg po daily for  depression. -Continue Lamictal 25 mg po daily for mood stabilization.  -Continue Melatonin 3 mg po Q hs for insomnia.  -Continue Nicotine  patch 21 mg trans-dermally Q 24 hrs for smoking cessation.  -Continue Nicorette  gum 2 mg po daily for smoking cessation.    Labs: Will obtain T3, T4 due to TSH (H). Reviewed 06-19 result. The T4 is within norm. T3 result, within norm..   Agitation protocols.  -Continue as recommended.   Other PRNS -Continue Tylenol  650 mg every 6 hours PRN for mild pain -Continue Maalox 30 ml Q 4 hrs PRN for indigestion -Continue MOM 30 ml po Q 6 hrs for constipation   Safety and Monitoring: Voluntary admission to inpatient psychiatric unit for safety, stabilization and treatment Daily contact with patient to assess and evaluate symptoms and progress in treatment Patient's case to be discussed in multi-disciplinary team meeting Observation Level : q15 minute checks Vital signs: q12 hours Precautions: Safety   Discharge Planning: Social work and case management to assist with discharge planning and identification of hospital follow-up needs prior to discharge Estimated LOS: 5-7 days Discharge Concerns: Need to establish a safety plan; Medication compliance and effectiveness Discharge Goals: Return home with outpatient referrals for mental health follow-up including medication management/psychotherapy  Asuncion Layer, NP, pmhnp, fnp-bc. 05/31/2024, 12:59 PM Patient ID: Debbie Long, female   DOB: 02-08-2006, 18 y.o.   MRN: [id number]

## 2024-05-31 NOTE — Group Note (Signed)
 Date:  05/31/2024 Time:  10:46 AM  Group Topic/Focus:  Goals Group:   The focus of this group is to help patients establish daily goals to achieve during treatment and discuss how the patient can incorporate goal setting into their daily lives to aide in recovery.    Participation Level:  Did Not Attend  Participation Quality:    Affect:    Cognitive:    Insight:   Engagement in Group:    Modes of Intervention:    Additional Comments:  Pt were aware of group time. Pt refused to attend group.  Tita Form 05/31/2024, 10:46 AM

## 2024-05-31 NOTE — Progress Notes (Signed)
   05/31/24 0900  Psych Admission Type (Psych Patients Only)  Admission Status Voluntary  Psychosocial Assessment  Patient Complaints Anxiety  Eye Contact Fair  Facial Expression Flat  Affect Anxious  Speech Soft  Interaction Assertive  Motor Activity Other (Comment) (WNL)  Appearance/Hygiene In scrubs  Behavior Characteristics Cooperative  Mood Anxious  Thought Process  Coherency WDL  Content WDL  Delusions None reported or observed  Perception WDL  Hallucination None reported or observed  Judgment Limited  Confusion None  Danger to Self  Current suicidal ideation? Denies  Danger to Others  Danger to Others None reported or observed

## 2024-05-31 NOTE — Plan of Care (Signed)
  Problem: Education: Goal: Knowledge of [facility] Education information/materials will improve Outcome: Adequate for Discharge Goal: Emotional status will improve Outcome: Progressing   Problem: Coping: Goal: Ability to verbalize frustrations and anger appropriately will improve Outcome: Progressing   Problem: Health Behavior/Discharge Planning: Goal: Compliance with treatment plan for underlying cause of condition will improve Outcome: Progressing

## 2024-05-31 NOTE — Group Note (Signed)
 Date:  05/31/2024 Time:  12:14 PM  Group Topic/Focus:  Wellness Toolbox:   The focus of this group is to discuss various aspects of wellness, balancing those aspects and exploring ways to increase the ability to experience wellness.  Patients will create a wellness toolbox for use upon discharge.    Participation Level:  Did Not Attend  Participation Quality:    Affect:    Cognitive:    Insight:   Engagement in Group:    Modes of Intervention:    Additional Comments:  Did not attend  Tita Form 05/31/2024, 12:14 PM

## 2024-06-01 MED ORDER — ENSURE PLUS HIGH PROTEIN PO LIQD: 237.0000 mL | Freq: Two times a day (BID) | ORAL | Status: DC

## 2024-06-01 MED ORDER — LAMOTRIGINE 25 MG PO TABS: 25.0000 mg | ORAL_TABLET | Freq: Every day | ORAL | 0 refills | Status: DC

## 2024-06-01 MED ORDER — NICOTINE 21 MG/24HR TD PT24: 21.0000 mg | MEDICATED_PATCH | Freq: Every day | TRANSDERMAL | 0 refills | Status: DC

## 2024-06-01 MED ORDER — ATOMOXETINE HCL 25 MG PO CAPS: 25.0000 mg | ORAL_CAPSULE | Freq: Every day | ORAL | 0 refills | Status: DC

## 2024-06-01 MED ORDER — HYDROXYZINE HCL 25 MG PO TABS: 25.0000 mg | ORAL_TABLET | Freq: Three times a day (TID) | ORAL | 0 refills | Status: DC | PRN

## 2024-06-01 MED ORDER — FLUOXETINE HCL 40 MG PO CAPS: 40.0000 mg | ORAL_CAPSULE | Freq: Every morning | ORAL | 0 refills | Status: DC

## 2024-06-01 MED ORDER — MELATONIN 3 MG PO TABS: 3.0000 mg | ORAL_TABLET | Freq: Every day | ORAL | 0 refills | Status: DC

## 2024-06-01 MED ORDER — GUANFACINE HCL ER 4 MG PO TB24: 4.0000 mg | ORAL_TABLET | Freq: Every day | ORAL | 0 refills | Status: DC

## 2024-06-01 NOTE — [facility] Suicide Risk Assessment (Signed)
 [facility] Discharge Suicide Risk Assessment   Principal Problem: MDD (major depressive disorder), recurrent severe, without psychosis (HCC) Discharge Diagnoses: Principal Problem:   MDD (major depressive disorder), recurrent severe, without psychosis (HCC) Active Problems:   DMDD (disruptive mood dysregulation disorder) (HCC)   ADHD (attention deficit hyperactivity disorder), combined type   Borderline personality disorder (HCC)   Bipolar I disorder, most recent episode depressed (HCC)   Total Time spent with patient: 30 minutes  Musculoskeletal: Strength & Muscle Tone: within normal limits Gait & Station: normal Patient leans: N/A  Psychiatric Specialty Exam  Presentation  General Appearance:  Casually dressed, not in any distress, appropriate behavior, engaged politely.  No EPS.  Eye Contact: Good.  Speech: Spontaneous.  Normal rate, tone and volume.  Normal prosody of speech.  Mood and Affect  Mood: Euthymic.  Affect: Full range and appropriate.  Thought Process  Thought Processes: Linear and goal directed.  Descriptions of Associations:Intact  Orientation:Full (Time, Place and Person)  Thought Content: Future oriented.  No current suicidal thoughts.  No homicidal thoughts.  No thoughts of violence.  No negative ruminative flooding.  No guilty ruminations.  No delusional theme.  No obsessions.  Hallucinations: No hallucination in any modality.  Sensorium  Memory: Good.  Judgment: Good.  Insight: Good  Executive Functions  Concentration: Good.  Attention Span: Good.  Recall: Good.  Fund of Knowledge: Good.  Language: Good   Psychomotor Activity  Normal psychomotor activity   Physical Exam: Physical Exam ROS Blood pressure 113/72, pulse 67, temperature 98.2 F (36.8 C), temperature source Oral, resp. rate 20, height 5' 6 (1.676 m), weight 71.8 kg, SpO2 99%. Body mass index is 25.53 kg/m.  Mental Status Per Nursing Assessment::    On Admission:  NA  Demographic Factors:  Caucasian  Loss Factors: NA  Historical Factors: Impulsivity  Risk Reduction Factors:   Living with another person, especially a relative, Positive social support, Positive therapeutic relationship, and Positive coping skills or problem solving skills  Continued Clinical Symptoms:  No evidence of depression.  No evidence of mania.  No evidence of psychosis.  No adverse effects from her medications.  Cognitive Features That Contribute To Risk:  None    Suicide Risk:  Minimal: No current suicidal thoughts.  No current homicidal thoughts.  No current thoughts of violence.  Patient restarted her medications on the unit.  She responded well.  Her mood is stable.  No new stressor.  Her family remains supportive.  No legal issues.  No overwhelming anxiety. Patient is stable for care to lower setting.   Follow-up Information     [facility] Go on 06/12/2024.   Why: You have an appointment for medication management services on 06/12/24 at 10:00 am. The appointment will be held in person. Contact information: [address] [place] [address] [phone]         [facility] Go on 06/04/2024.   Why: You have an appointment for therapy services on 06/04/24 at 12:00 pm, in person. Contact information: [address], [place], [place] [address]  Phone: [phone] Fax:  [phone]                Plan Of Care/Follow-up recommendations:  See discharge summary.  Jerrell DELENA Forehand, MD 06/01/2024, 9:28 AM

## 2024-06-01 NOTE — Progress Notes (Signed)
 Adult Psychoeducational Group Note  Date:  06/01/2024 Time:  9:51 AM  Group Topic/Focus:  Goals Group:   The focus of this group is to help patients establish daily goals to achieve during treatment and discuss how the patient can incorporate goal setting into their daily lives to aide in recovery.  Participation Level:  Active  Participation Quality:  Appropriate  Affect:  Appropriate  Cognitive:  Appropriate  Insight: Appropriate  Engagement in Group:  Engaged  Modes of Intervention:  Discussion  Additional Comments:  Pt stated she is not feeling well. Pt goal for the day is to self regulate.  Daine Pillar D 06/01/2024, 9:51 AM

## 2024-06-01 NOTE — Group Note (Signed)
 LCSW Group Therapy Note 06/01/2024 Type of Therapy and Topic:  Group Therapy: Gratitude Participation Level:  Active Description of Group:   In this group, patients shared and discussed a positive experience that had beneficial impact on their life and those around them.  The group discussed how bringing the positive elements of their lives to the forefront of their minds can help with recovery from a physical or mental illness.  An exercise was done as a group to state mindfulness routines in order to reduce stress or anxiety level encourage participants to consider other potential positives in their lives.  Therapeutic Goals: Patients will participate by sharing a positive or impactful event that assisted to increase self-awareness and self-esteem.  Patients will discuss how to apply mindfulness technique with any situations. Patients will explore other coping skills to utilize during emotional dysregulation to assist with their daily tasks.   Summary of Patient Progress:  The patient shared their memories of successful event when she had an award for her art work and is grateful for recognition.  Patient's reaction to the group was good. Therapeutic Modalities:   Solution-Focused Therapy Activity  Debbie Long O Debbie Long, LCSWA 06/01/2024  12:37 PM

## 2024-06-01 NOTE — Progress Notes (Signed)
  [facility] Adult Case Management Discharge Plan :  Will you be returning to the same living situation after discharge:  Yes,  resides with grandparents At discharge, do you have transportation home?: Yes,  patient will contact grandmother to pick up Do you have the ability to pay for your medications: Yes,  patient has Medicaid  Release of information consent forms completed and in the chart;  Patient's signature needed at discharge.  Patient to Follow up at:  Follow-up Information     [facility] Go on 06/12/2024.   Why: You have an appointment for medication management services on 06/12/24 at 10:00 am. The appointment will be held in person. Contact information: [address] [place] [address] [phone]         [facility] Go on 06/04/2024.   Why: You have an appointment for therapy services on 06/04/24 at 12:00 pm, in person. Contact information: [address], [place], [place] [address]  Phone: [phone] Fax:  [phone]                Next level of care provider has access to [facility] Link:no  Safety Planning and Suicide Prevention discussed: Yes,  completed on 05/31/24 with grandmother Consuelo Keens     Has patient been referred to the Quitline?: Patient does not use tobacco/nicotine  products  Patient has been referred for addiction treatment: Yes, the patient will follow up with an outpatient provider for substance use disorder. Psychiatrist/APP: appointment made and Therapist: appointment made  Ermalinda Penton [place], LCSW 06/01/2024, 10:10 AM

## 2024-06-01 NOTE — Progress Notes (Signed)
   06/01/24 1000  Psych Admission Type (Psych Patients Only)  Admission Status Voluntary  Psychosocial Assessment  Patient Complaints Anxiety  Eye Contact Fair  Facial Expression Anxious  Affect Appropriate to circumstance  Speech Logical/coherent  Interaction Assertive  Motor Activity Slow  Appearance/Hygiene In scrubs  Behavior Characteristics Cooperative;Appropriate to situation  Mood Anxious  Thought Process  Coherency WDL  Content WDL  Delusions None reported or observed  Perception WDL  Hallucination None reported or observed  Judgment Limited  Confusion None  Danger to Self  Current suicidal ideation? Denies  Danger to Others  Danger to Others None reported or observed

## 2024-06-01 NOTE — Discharge Summary (Signed)
 Physician Discharge Summary Note  Patient:  Debbie Long is an 18 y.o., female MRN:  [id number] DOB:  12/10/2006 Patient phone:  [phone] (home)  Patient address:   [address] [place] [address],  Total Time spent with patient: 30 minutes  Date of Admission:  05/28/2024 Date of Discharge: 06/01/24   Reason for Admission:  18 year old Caucasian female with hx of major depressive disorder, bipolar disorder, ADHD & borderline disorder. Patient has been admitted/treated x several times in this [facility] at the adolescent's youth during her younger years & at the [facility] in February of 2025. Patient has hx of self-mutilating behaviors & multiple suicide attempts by overdose on medications & had tried strangulation on herself. Prior to this admission, she was receiving mental health treatments/medication management on an outpatient basis. Ludean is being admitted to the [facility] this time around with complaint of worsening suicidal ideations without any plans or intent. Chart review reports indicated that patient was having her counseling sessions with her therapist yesterday when she disclosed to her therapist that she has been feeling suicidal. The therapist instructed her to call the crisis hotline & she was taken to [facility] for psychiatric evaluation.    Principal Problem: MDD (major depressive disorder), recurrent severe, without psychosis (HCC) Discharge Diagnoses: Principal Problem:   MDD (major depressive disorder), recurrent severe, without psychosis (HCC) Active Problems:   DMDD (disruptive mood dysregulation disorder) (HCC)   ADHD (attention deficit hyperactivity disorder), combined type   Borderline personality disorder (HCC)   Bipolar I disorder, most recent episode depressed (HCC)   Past Psychiatric History: See H&P   Past Medical History:  Past Medical History:  Diagnosis Date   Anxiety    Asthma    Depression    Eating disorder    Migraine     Past Surgical History:   Procedure Laterality Date   TOE SURGERY     Family History: History reviewed. No pertinent family history. Family Psychiatric  History: See H&P Social History:  Social History   Substance and Sexual Activity  Alcohol Use Yes   Comment: socially     Social History   Substance and Sexual Activity  Drug Use Yes   Types: Marijuana    Social History   Socioeconomic History   Marital status: Single    Spouse name: Not on file   Number of children: Not on file   Years of education: Not on file   Highest education level: Not on file  Occupational History   Not on file  Tobacco Use   Smoking status: Some Days    Types: Cigarettes   Smokeless tobacco: Never  Vaping Use   Vaping status: Former  Substance and Sexual Activity   Alcohol use: Yes    Comment: socially   Drug use: Yes    Types: Marijuana   Sexual activity: Yes  Other Topics Concern   Not on file  Social History Narrative   Not on file   Social Drivers of Health   Financial Resource Strain: Low Risk  (08/25/2023)   Received from [facility]   Overall Financial Resource Strain (CARDIA)    Difficulty of Paying Living Expenses: Not hard at all  Food Insecurity: No Food Insecurity (05/28/2024)   Hunger Vital Sign    Worried About Running Out of Food in the Last Year: Never true    Ran Out of Food in the Last Year: Never true  Transportation Needs: No Transportation Needs (05/28/2024)   PRAPARE -  Administrator, Civil Service (Medical): No    Lack of Transportation (Non-Medical): No  Physical Activity: Not on file  Stress: Not on file  Social Connections: Unknown (04/25/2022)   Received from [facility]   Social Network    Social Network: Not on file    Hospital Course:  During the patient's hospitalization, patient had extensive initial psychiatric evaluation, and follow-up psychiatric evaluations every day.  Psychiatric diagnoses provided upon initial assessment:  Principal Problem:    MDD (major depressive disorder), recurrent severe, without psychosis (HCC) Active Problems:   DMDD (disruptive mood dysregulation disorder) (HCC)   ADHD (attention deficit hyperactivity disorder), combined type   Borderline personality disorder (HCC)   Bipolar I disorder, most recent episode depressed (HCC)  Patient's psychiatric medications were adjusted on admission: -Continue Strattera  25 mg po daily for ADHD.  -Continue Guanfacine  ER 4 mg po daily for ADHD.  -Continue Fluoxetine  40 mg po daily for depression. -Continue Lamictal  25 mg po daily for mood stabilization.  -Continue Melatonin 3 mg po Q hs for insomnia.   During the hospitalization, the patient's medication regimen was resumed at the current dosage levels.  Patient's care was discussed during the interdisciplinary team meeting every day during the hospitalization.  The patient denies having side effects to prescribed psychiatric medication.  Gradually, patient started adjusting to milieu. The patient was evaluated each day by a clinical provider to ascertain response to treatment. Improvement was noted by the patient's report of decreasing symptoms, improved sleep and appetite, affect, medication tolerance, behavior, and participation in unit programming.  Patient was asked each day to complete a self inventory noting mood, mental status, pain, new symptoms, anxiety and concerns.    Symptoms were reported as significantly decreased or resolved completely by discharge.   On day of discharge, the patient reports that their mood is stable. The patient denied having suicidal thoughts for more than 48 hours prior to discharge.  Patient denies having homicidal thoughts.  Patient denies having auditory hallucinations.  Patient denies any visual hallucinations or other symptoms of psychosis. The patient was motivated to continue taking medication with a goal of continued improvement in mental health.   The patient reports their target  psychiatric symptoms of anxiety, depression, and insomnia responded well to the psychiatric medications, and the patient reports overall benefit other psychiatric hospitalization. Supportive psychotherapy was provided to the patient. The patient also participated in regular group therapy while hospitalized. Coping skills, problem solving as well as relaxation therapies were also part of the unit programming.  Labs were reviewed with the patient, and abnormal results were discussed with the patient.  The patient is able to verbalize their individual safety plan to this provider.  # It is recommended to the patient to continue psychiatric medications as prescribed, after discharge from the hospital.    # It is recommended to the patient to follow up with your outpatient psychiatric provider and PCP.  # It was discussed with the patient, the impact of alcohol, drugs, tobacco have been there overall psychiatric and medical wellbeing, and total abstinence from substance use was recommended the patient.ed.  # Prescriptions provided or sent directly to preferred pharmacy at discharge. Patient agreeable to plan. Given opportunity to ask questions. Appears to feel comfortable with discharge.    # In the event of worsening symptoms, the patient is instructed to call the crisis hotline, 911 and or go to the nearest ED for appropriate evaluation and treatment of symptoms. To follow-up  with primary care provider for other medical issues, concerns and or health care needs  # Patient was discharged home with a plan to follow up as noted below.   Physical Findings: AIMS:  , ,  N/A,  ,  ,  ,   CIWA:   N/A COWS:  N/A   Musculoskeletal: Strength & Muscle Tone: within normal limits Gait & Station: normal Patient leans: N/A   Psychiatric Specialty Exam:  Presentation  General Appearance:  Casual  Eye Contact: Good  Speech: Clear and Coherent  Speech Volume: Normal  Handedness: Right   Mood  and Affect  Mood: Euthymic  Affect: Appropriate; Full Range   Thought Process  Thought Processes: Linear; Coherent  Descriptions of Associations:Intact  Orientation:Full (Time, Place and Person)  Thought Content:Logical  History of Schizophrenia/Schizoaffective disorder:No  Duration of Psychotic Symptoms:N/A  Hallucinations:Hallucinations: None  Ideas of Reference:None  Suicidal Thoughts:Suicidal Thoughts: No  Homicidal Thoughts:Homicidal Thoughts: No   Sensorium  Memory: Immediate Good; Recent Good; Remote Good  Judgment: Good  Insight: Good   Executive Functions  Concentration: Good  Attention Span: Good  Recall: Good  Fund of Knowledge: Good  Language: Good   Psychomotor Activity  Psychomotor Activity: Psychomotor Activity: Normal   Assets  Assets: Communication Skills; Desire for Improvement; Housing; Physical Health; Resilience; Social Support   Sleep  Sleep: Sleep: Good  Estimated Sleeping Duration (Last 24 Hours): 6.25-7.50 hours   Physical Exam: Physical Exam Vitals and nursing note reviewed.  Constitutional:      General: She is not in acute distress. HENT:     Nose: Nose normal.     Mouth/Throat:     Pharynx: Oropharynx is clear.   Cardiovascular:     Rate and Rhythm: Normal rate.     Pulses: Normal pulses.  Pulmonary:     Effort: No respiratory distress.   Musculoskeletal:        General: Normal range of motion.     Cervical back: Normal range of motion.   Skin:    General: Skin is dry.   Neurological:     General: No focal deficit present.     Mental Status: She is alert and oriented to person, place, and time. Mental status is at baseline.   Psychiatric:        Mood and Affect: Mood normal.        Behavior: Behavior normal.        Thought Content: Thought content normal.        Judgment: Judgment normal.    Review of Systems  Constitutional: Negative.   Respiratory: Negative.     Cardiovascular: Negative.   Gastrointestinal: Negative.   Skin: Negative.   Neurological: Negative.   Psychiatric/Behavioral:  Positive for depression (Stable for outpatient management) and substance abuse (UDS + THC). Negative for hallucinations, memory loss and suicidal ideas. The patient is nervous/anxious (Stable for outpatient management). The patient does not have insomnia.    Blood pressure 113/72, pulse 67, temperature 98.2 F (36.8 C), temperature source Oral, resp. rate 20, height 5' 6 (1.676 m), weight 71.8 kg, SpO2 99%. Body mass index is 25.53 kg/m.   Social History   Tobacco Use  Smoking Status Some Days   Types: Cigarettes  Smokeless Tobacco Never   Tobacco Cessation:  A prescription for an FDA-approved tobacco cessation medication provided at discharge   Blood Alcohol level:  Lab Results  Component Value Date   [facility] <15 05/28/2024   ETH <10 01/21/2024  Metabolic Disorder Labs:  Lab Results  Component Value Date   HGBA1C 5.3 05/28/2024   MPG 105.41 05/28/2024   MPG 102.54 01/21/2024   No results found for: PROLACTIN Lab Results  Component Value Date   CHOL 112 05/28/2024   TRIG 76 05/28/2024   HDL 32 (L) 05/28/2024   CHOLHDL 3.5 05/28/2024   VLDL 15 05/28/2024   LDLCALC 65 05/28/2024   LDLCALC 88 01/21/2024    See Psychiatric Specialty Exam and Suicide Risk Assessment completed by Attending Physician prior to discharge.  Discharge destination:  Home  Is patient on multiple antipsychotic therapies at discharge:  No   Has Patient had three or more failed trials of antipsychotic monotherapy by history:  No  Recommended Plan for Multiple Antipsychotic Therapies: NA  Discharge Instructions     Activity as tolerated - No restrictions   Complete by: As directed    Diet - low sodium heart healthy   Complete by: As directed       Allergies as of 06/01/2024       Reactions   Cherry Nausea And Vomiting, Other (See Comments)   Headaches  and migraines   Lactose Intolerance (gi) Diarrhea, Other (See Comments)   Major bloating        Medication List     STOP taking these medications    cariprazine  3 MG capsule Commonly known as: Vraylar        TAKE these medications      Indication  albuterol  108 (90 Base) MCG/ACT inhaler Commonly known as: VENTOLIN  HFA Inhale 2 puffs into the lungs every 4 (four) hours as needed for wheezing or shortness of breath.  Indication: Asthma   atomoxetine  25 MG capsule Commonly known as: STRATTERA  Take 1 capsule (25 mg total) by mouth daily. Start taking on: June 02, 2024  Indication: Attention Deficit Hyperactivity Disorder   feeding supplement Liqd Take 237 mLs by mouth 2 (two) times daily between meals.  Indication: Nutritional Support   FLUoxetine  40 MG capsule Commonly known as: PROZAC  Take 1 capsule (40 mg total) by mouth in the morning. Start taking on: June 02, 2024 What changed:  medication strength how much to take when to take this  Indication: Depression   guanFACINE  4 MG Tb24 ER tablet Commonly known as: INTUNIV  Take 1 tablet (4 mg total) by mouth daily. Start taking on: June 02, 2024 What changed: when to take this  Indication: Attention Deficit Hyperactivity Disorder   hydrOXYzine  25 MG tablet Commonly known as: ATARAX  Take 1 tablet (25 mg total) by mouth 3 (three) times daily as needed for anxiety.  Indication: Feeling Anxious, Feeling Tense   lamoTRIgine  25 MG tablet Commonly known as: LAMICTAL  Take 1 tablet (25 mg total) by mouth daily. Start taking on: June 02, 2024  Indication: Manic-Depression   melatonin 3 MG Tabs tablet Take 1 tablet (3 mg total) by mouth at bedtime.  Indication: Trouble Sleeping   nicotine  21 mg/24hr patch Commonly known as: NICODERM CQ  - dosed in mg/24 hours Place 1 patch (21 mg total) onto the skin daily at 6 (six) AM. Start taking on: June 02, 2024  Indication: Nicotine  Addiction   nicotine  polacrilex 2 MG  gum Commonly known as: NICORETTE  Take 1 each (2 mg total) by mouth as needed for smoking cessation.  Indication: Nicotine  Addiction        Follow-up Information     [facility] Go on 06/12/2024.   Why: You have an appointment  for medication management services on 06/12/24 at 10:00 am. The appointment will be held in person. Contact information: [address] [place] [address] [phone]         [facility] Go on 06/04/2024.   Why: You have an appointment for therapy services on 06/04/24 at 12:00 pm, in person. Contact information: [address], [place], [place] [address]  Phone: [phone] Fax:  [phone]                Follow-up recommendations:   Activity: as tolerated  Diet: heart healthy  Other: -Follow-up with your outpatient psychiatric provider -instructions on appointment date, time, and address (location) are provided to you in discharge paperwork.  -Take your psychiatric medications as prescribed at discharge - instructions are provided to you in the discharge paperwork  -Follow-up with outpatient primary care doctor and other specialists -for management of preventative medicine and chronic medical disease.  -Testing: Follow-up with outpatient provider for abnormal lab results: Low TSH, low HDL  -If you are prescribed an atypical antipsychotic medication, we recommend that your outpatient psychiatrist follow routine screening for side effects within 3 months of discharge, including monitoring: AIMS scale, height, weight, blood pressure, fasting lipid panel, HbA1c, and fasting blood sugar.   -Recommend total abstinence from alcohol, tobacco, and other illicit drug use at discharge.   -If your psychiatric symptoms recur, worsen, or if you have side effects to your psychiatric medications, call your outpatient psychiatric provider, 911, [phone] or go to the nearest emergency  department.  -If suicidal thoughts occur, immediately call your outpatient psychiatric provider, 911, 988 or go to the nearest emergency department.    Signed: Blair Chiquita Hint, NP 06/01/2024, 11:06 AM

## 2024-06-01 NOTE — Progress Notes (Signed)
Pt discharged to lobby with taxi voucher. Pt was stable and appreciative at that time. All papers and electronic prescriptions were given and valuables returned. Suicide safety plan completed. Verbal understanding expressed. Denies SI/HI and A/VH. Pt given opportunity to express concerns and ask questions.

## 2024-10-29 ENCOUNTER — Other Ambulatory Visit (HOSPITAL_COMMUNITY)
Admission: RE | Admit: 2024-10-29 | Discharge: 2024-10-29 | Disposition: A | Discharge status: Home / Self Care | Payer: MEDICAID | Source: Ambulatory Visit | Attending: Obstetrics and Gynecology | Admitting: Obstetrics and Gynecology

## 2024-10-29 ENCOUNTER — Encounter: Payer: Self-pay | Admitting: Obstetrics and Gynecology

## 2024-10-29 ENCOUNTER — Ambulatory Visit (INDEPENDENT_AMBULATORY_CARE_PROVIDER_SITE_OTHER): Payer: MEDICAID | Admitting: Obstetrics and Gynecology

## 2024-10-29 VITALS — BP 137/92 | HR 93 | Ht 66.0 in | Wt 140.8 lb

## 2024-10-29 DIAGNOSIS — N939 Abnormal uterine and vaginal bleeding, unspecified: Secondary | ICD-10-CM | POA: Diagnosis not present

## 2024-10-29 DIAGNOSIS — Z113 Encounter for screening for infections with a predominantly sexual mode of transmission: Secondary | ICD-10-CM | POA: Diagnosis present

## 2024-10-29 DIAGNOSIS — N898 Other specified noninflammatory disorders of vagina: Secondary | ICD-10-CM

## 2024-10-29 DIAGNOSIS — Z30431 Encounter for routine checking of intrauterine contraceptive device: Secondary | ICD-10-CM | POA: Diagnosis not present

## 2024-10-29 NOTE — Progress Notes (Unsigned)
   GYNECOLOGY OFFICE NOTE  History:  18 y.o. G0P0000 here today for IUD check and irregular bleeding. For about 5 months, she has had irregular bleeding. Mirena  IUD placed 10/2023. She had no periods for several months, then started having irregular bleeding. Can bleed for a day, or two days or 7 days, there is not predictability to it.   She has always had very irregular bleeding and this is similar to what her periods are like if she is not on contraception.   No new medications. Having white vaginal discharge.   Past Medical History:  Diagnosis Date   Anxiety    Asthma    Depression    Eating disorder    Migraine    Past Surgical History:  Procedure Laterality Date   TOE SURGERY      No current outpatient medications on file.  The following portions of the patient's history were reviewed and updated as appropriate: allergies, current medications, past family history, past medical history, past social history, past surgical history and problem list.   Review of Systems:  Pertinent items noted in HPI and remainder of comprehensive ROS otherwise negative.   Objective:  Physical Exam BP (!) 137/92   Pulse 93   Ht 5' 6 (1.676 m)   Wt 140 lb 12.8 oz (63.9 kg)   LMP 10/28/2024 (Exact Date)   BMI 22.73 kg/m  CONSTITUTIONAL: Well-developed, well-nourished female in no acute distress.  HENT:  Normocephalic, atraumatic. External right and left ear normal. Oropharynx is clear and moist EYES: Conjunctivae and EOM are normal. Pupils are equal, round, and reactive to light. No scleral icterus.  NECK: Normal range of motion, supple, no masses SKIN: Skin is warm and dry. No rash noted. Not diaphoretic. No erythema. No pallor. NEUROLOGIC: Alert and oriented to person, place, and time. Normal reflexes, muscle tone coordination. No cranial nerve deficit noted. PSYCHIATRIC: Normal mood and affect. Normal behavior. Normal judgment and thought content. CARDIOVASCULAR: Normal heart rate  noted RESPIRATORY: Effort normal, no problems with respiration noted ABDOMEN: Soft, no distention noted.   PELVIC: ***Normal appearing external genitalia; ***normal appearing vaginal mucosa and cervix.  No abnormal discharge noted.  Purple IUD Pap smear obtained.  ***pelvic cultures obtained. ***Normal uterine size, no other palpable masses, ***no uterine or adnexal tenderness. MUSCULOSKELETAL: Normal range of motion. No edema noted.  ***Exam done with chaperone present.  Labs and Imaging No results found.  Assessment & Plan:  There are no diagnoses linked to this encounter.  Routine preventative health maintenance measures emphasized. Please refer to After Visit Summary for other counseling recommendations.   No follow-ups on file.   LOIS Yolanda Moats, MD, [facility] Attending Center for [employer] [facility])

## 2024-10-29 NOTE — Progress Notes (Unsigned)
 Pt presents for IUD string check. Pt had bleeding on 10/28/24 for one day. Pt wants all std testing. Pt has been bleeding off and on for 5 months. Pt scored 22 on PHQ-9 and 16 on GAD-7. Pt declines any referrals to talk to anyone at this time.

## 2024-10-30 ENCOUNTER — Ambulatory Visit: Payer: Self-pay | Admitting: Obstetrics and Gynecology

## 2024-10-30 LAB — HEPATITIS C ANTIBODY: Hep C Virus Ab: NONREACTIVE

## 2024-10-30 LAB — HEPATITIS B SURFACE ANTIGEN: Hepatitis B Surface Ag: NEGATIVE

## 2024-10-30 LAB — RPR: RPR Ser Ql: NONREACTIVE

## 2024-10-30 LAB — HIV ANTIBODY (ROUTINE TESTING W REFLEX): HIV Screen 4th Generation wRfx: NONREACTIVE

## 2024-10-30 MED ORDER — MEDROXYPROGESTERONE ACETATE 10 MG PO TABS: 10.0000 mg | ORAL_TABLET | Freq: Every day | ORAL | 2 refills | Status: AC

## 2024-10-31 ENCOUNTER — Other Ambulatory Visit: Payer: Self-pay

## 2024-10-31 LAB — CERVICOVAGINAL ANCILLARY ONLY
Bacterial Vaginitis (gardnerella): POSITIVE — AB
Candida Glabrata: NEGATIVE
Candida Vaginitis: NEGATIVE
Chlamydia: NEGATIVE
Comment: NEGATIVE
Comment: NEGATIVE
Comment: NEGATIVE
Comment: NEGATIVE
Comment: NEGATIVE
Comment: NORMAL
Neisseria Gonorrhea: NEGATIVE
Trichomonas: NEGATIVE

## 2024-10-31 MED ORDER — METRONIDAZOLE 500 MG PO TABS
500.0000 mg | ORAL_TABLET | Freq: Two times a day (BID) | ORAL | 0 refills | Status: AC
Start: 2024-10-31 — End: ?
